# Patient Record
Sex: Male | Born: 1970 | Race: White | Hispanic: No | Smoking: Former smoker
Health system: Southern US, Community
[De-identification: ages and names within clinical notes are randomized; demographics above are authoritative.]

## PROBLEM LIST (undated history)

## (undated) DIAGNOSIS — E119 Type 2 diabetes mellitus without complications: Secondary | ICD-10-CM

## (undated) HISTORY — PX: ANKLE FRACTURE SURGERY: SHX122

---

## 2003-01-14 ENCOUNTER — Emergency Department (HOSPITAL_COMMUNITY): Admission: EM | Admit: 2003-01-14 | Discharge: 2003-01-14 | Payer: Self-pay | Admitting: Emergency Medicine

## 2014-10-04 ENCOUNTER — Emergency Department (HOSPITAL_COMMUNITY)
Admission: EM | Admit: 2014-10-04 | Discharge: 2014-10-04 | Disposition: A | Discharge status: Home / Self Care | Payer: Self-pay | Attending: Emergency Medicine | Admitting: Emergency Medicine

## 2014-10-04 ENCOUNTER — Encounter (HOSPITAL_COMMUNITY): Payer: Self-pay | Admitting: Emergency Medicine

## 2014-10-04 DIAGNOSIS — E1165 Type 2 diabetes mellitus with hyperglycemia: Secondary | ICD-10-CM | POA: Insufficient documentation

## 2014-10-04 DIAGNOSIS — Z720 Tobacco use: Secondary | ICD-10-CM | POA: Insufficient documentation

## 2014-10-04 DIAGNOSIS — R739 Hyperglycemia, unspecified: Secondary | ICD-10-CM

## 2014-10-04 HISTORY — DX: Type 2 diabetes mellitus without complications: E11.9

## 2014-10-04 LAB — URINE MICROSCOPIC-ADD ON

## 2014-10-04 LAB — URINALYSIS, ROUTINE W REFLEX MICROSCOPIC
Bilirubin Urine: NEGATIVE
Glucose, UA: >1000 mg/dL — AB
Hgb urine dipstick: NEGATIVE
Ketones, ur: 40 mg/dL — AB
Leukocytes, UA: NEGATIVE
Nitrite: NEGATIVE
Protein, ur: NEGATIVE mg/dL
Specific Gravity, Urine: 1.034 — ABNORMAL HIGH (ref 1.005–1.030)
Urobilinogen, UA: 0.2 mg/dL (ref 0.0–1.0)
pH: 5 (ref 5.0–8.0)

## 2014-10-04 LAB — CBC WITH DIFFERENTIAL/PLATELET
Basophils Absolute: 0 10*3/uL (ref 0.0–0.1)
Basophils Relative: 0 % (ref 0–1)
Eosinophils Absolute: 0.2 10*3/uL (ref 0.0–0.7)
Eosinophils Relative: 1 % (ref 0–5)
HCT: 43.8 % (ref 39.0–52.0)
Hemoglobin: 16.1 g/dL (ref 13.0–17.0)
Lymphocytes Relative: 23 % (ref 12–46)
Lymphs Abs: 2.6 10*3/uL (ref 0.7–4.0)
MCH: 30.2 pg (ref 26.0–34.0)
MCHC: 36.8 g/dL — ABNORMAL HIGH (ref 30.0–36.0)
MCV: 82.2 fL (ref 78.0–100.0)
Monocytes Absolute: 0.6 10*3/uL (ref 0.1–1.0)
Monocytes Relative: 5 % (ref 3–12)
Neutro Abs: 7.6 10*3/uL (ref 1.7–7.7)
Neutrophils Relative %: 70 % (ref 43–77)
Platelets: 209 10*3/uL (ref 150–400)
RBC: 5.33 MIL/uL (ref 4.22–5.81)
RDW: 12.9 % (ref 11.5–15.5)
WBC: 10.9 10*3/uL — ABNORMAL HIGH (ref 4.0–10.5)

## 2014-10-04 LAB — BASIC METABOLIC PANEL
Anion gap: 19 — ABNORMAL HIGH (ref 5–15)
BUN: 15 mg/dL (ref 6–23)
CO2: 21 mEq/L (ref 19–32)
Calcium: 10.1 mg/dL (ref 8.4–10.5)
Chloride: 88 mEq/L — ABNORMAL LOW (ref 96–112)
Creatinine, Ser: 0.75 mg/dL (ref 0.50–1.35)
GFR calc Af Amer: >90 mL/min (ref >90)
GFR calc non Af Amer: >90 mL/min (ref >90)
Glucose, Bld: 563 mg/dL (ref 70–99)
Potassium: 4.5 mEq/L (ref 3.7–5.3)
Sodium: 128 mEq/L — ABNORMAL LOW (ref 137–147)

## 2014-10-04 LAB — CBG MONITORING, ED
Glucose-Capillary: 571 mg/dL (ref 70–99)
Glucose-Capillary: 374 mg/dL — ABNORMAL HIGH (ref 70–99)
Glucose-Capillary: 302 mg/dL — ABNORMAL HIGH (ref 70–99)

## 2014-10-04 MED ORDER — SODIUM CHLORIDE 0.9 % IV BOLUS (SEPSIS)
2000.0000 mL | Freq: Once | INTRAVENOUS | Status: AC
  Administered 2014-10-04: 2000 mL via INTRAVENOUS

## 2014-10-04 MED ORDER — INSULIN ASPART 100 UNIT/ML ~~LOC~~ SOLN
5.0000 [IU] | Freq: Once | SUBCUTANEOUS | Status: AC
  Administered 2014-10-04: 5 [IU] via INTRAVENOUS
  Filled 2014-10-04: qty 1

## 2014-10-04 MED ORDER — ONDANSETRON HCL 4 MG/2ML IJ SOLN
4.0000 mg | Freq: Once | INTRAMUSCULAR | Status: AC
  Administered 2014-10-04: 4 mg via INTRAVENOUS
  Filled 2014-10-04: qty 2

## 2014-10-04 MED ORDER — KETOROLAC TROMETHAMINE 15 MG/ML IJ SOLN
15.0000 mg | Freq: Once | INTRAMUSCULAR | Status: AC
  Administered 2014-10-04: 15 mg via INTRAVENOUS
  Filled 2014-10-04: qty 1

## 2014-10-04 MED ORDER — HYDROMORPHONE HCL 1 MG/ML IJ SOLN
1.0000 mg | Freq: Once | INTRAMUSCULAR | Status: AC
  Administered 2014-10-04: 1 mg via INTRAVENOUS
  Filled 2014-10-04: qty 1

## 2014-10-04 MED ORDER — SODIUM CHLORIDE 0.9 % IV BOLUS (SEPSIS)
1000.0000 mL | Freq: Once | INTRAVENOUS | Status: AC
  Administered 2014-10-04: 1000 mL via INTRAVENOUS

## 2014-10-04 MED ORDER — OXYCODONE-ACETAMINOPHEN 5-325 MG PO TABS
1.0000 | ORAL_TABLET | ORAL | Status: DC | PRN
Start: 2014-10-04 — End: 2014-10-05

## 2014-10-04 MED ORDER — INSULIN ASPART 100 UNIT/ML ~~LOC~~ SOLN
10.0000 [IU] | Freq: Once | SUBCUTANEOUS | Status: AC
  Administered 2014-10-04: 10 [IU] via INTRAVENOUS
  Filled 2014-10-04: qty 1

## 2014-10-04 MED ORDER — LISINOPRIL 40 MG PO TABS: 40.0000 mg | ORAL_TABLET | Freq: Every day | ORAL | Status: DC

## 2014-10-04 MED ORDER — METFORMIN HCL 1000 MG PO TABS: 1000.0000 mg | ORAL_TABLET | Freq: Two times a day (BID) | ORAL | Status: DC

## 2014-10-04 MED ORDER — GLIPIZIDE 10 MG PO TABS: 10.0000 mg | ORAL_TABLET | Freq: Every day | ORAL | Status: DC

## 2014-10-04 NOTE — ED Notes (Signed)
Per pt, states he has been out of meds for 2 weeks-left work because he didn't feel well-checked sugar and it read high

## 2014-10-04 NOTE — ED Provider Notes (Signed)
CSN: [id number]     Arrival date & time 10/04/14  1336 History   First MD Initiated Contact with Patient 10/04/14 1413     Chief Complaint  Patient presents with  . Hyperglycemia     (Consider location/radiation/quality/duration/timing/severity/associated sxs/prior Treatment) HPI   43 year old male with hyperglycemia. Diagnosed history diabetes. Previously prescribed metformin and glipizide. He stopped taking these for the past 2-3 weeks as he ran out. Unable to get new prescriptions without appointment with PCP as he has not been seen in some time at the office. Today while he was at work he began to ask some blurred vision. He checked his blood sugar and it registered as "high." Patient states over the last week or so he has had polyuria and polydipsia. Very fatigued. Even when on his medications, he states that his blood sugar was "never less than 200." No fever or chills. No urinary complaints aside from polyuria. No cough. No shortness of breath. No confusion person and other at bedside. Review of systems initially positive for a left upper molar dental pain. He has a broken tooth which is then putting off seeking dental care for. No facial swelling. No difficulty breathing or swallowing.  Past Medical History  Diagnosis Date  . Diabetes mellitus without complication    Past Surgical History  Procedure Laterality Date  . Ankle fracture surgery     No family history on file. History  Substance Use Topics  . Smoking status: Current Every Day Smoker  . Smokeless tobacco: Not on file  . Alcohol Use: No    Review of Systems  All systems reviewed and negative, other than as noted in HPI.   Allergies  Review of patient's allergies indicates not on file.  Home Medications   Prior to Admission medications   Not on File   BP 127/72 mmHg  Pulse 79  Temp(Src) 98.5 F (36.9 C) (Oral)  Resp 18  SpO2 96% Physical Exam  Constitutional: He appears well-developed and  well-nourished. No distress.  HENT:  Head: Normocephalic and atraumatic.  Mouth/Throat: Oropharynx is clear and moist.  Eyes: Conjunctivae are normal. Pupils are equal, round, and reactive to light. Right eye exhibits no discharge. Left eye exhibits no discharge.  Neck: Neck supple.  Cardiovascular: Normal rate, regular rhythm and normal heart sounds.  Exam reveals no gallop and no friction rub.   No murmur heard. Pulmonary/Chest: Effort normal and breath sounds normal. No respiratory distress. He has no wheezes.  Abdominal: Soft. He exhibits no distension. There is no tenderness.  Musculoskeletal: He exhibits no edema or tenderness.  Neurological: He is alert.  Skin: Skin is warm and dry.  Psychiatric: He has a normal mood and affect. His behavior is normal. Thought content normal.  Nursing note and vitals reviewed.   ED Course  Procedures (including critical care time) Labs Review Labs Reviewed  BASIC METABOLIC PANEL - Abnormal; Notable for the following:    Sodium 128 (*)    Chloride 88 (*)    Glucose, Bld 563 (*)    Anion gap 19 (*)    All other components within normal limits  CBC WITH DIFFERENTIAL - Abnormal; Notable for the following:    WBC 10.9 (*)    MCHC 36.8 (*)    All other components within normal limits  URINALYSIS, ROUTINE W REFLEX MICROSCOPIC - Abnormal; Notable for the following:    Specific Gravity, Urine 1.034 (*)    Glucose, UA >1000 (*)    Ketones, ur 40 (*)  All other components within normal limits  CBG MONITORING, ED - Abnormal; Notable for the following:    Glucose-Capillary 571 (*)    All other components within normal limits  CBG MONITORING, ED - Abnormal; Notable for the following:    Glucose-Capillary 374 (*)    All other components within normal limits  CBG MONITORING, ED - Abnormal; Notable for the following:    Glucose-Capillary 302 (*)    All other components within normal limits  URINE MICROSCOPIC-ADD ON    Imaging Review No  results found.   EKG Interpretation None      MDM   Final diagnoses:  Hyperglycemia  Poorly controlled diabetes mellitus    43 year old male with hyperglycemia. No metabolic acidosis, but he does have an anion gap and ketonuria. Tired appearing, but nontoxic. Pt is not interested in being admitted although I think it is prudent particularly with no follow-up currently. He has medical decision making capability. Has been off of medications for 2-3 weeks. Reports glucose "never under 200" even before this though. Previously on glipizide 5 mg daily and metformin 500 mg BID previously. New prescriptions provided. Suspect he will additionally need to be started on insulin. Discussed need to establish PCP ASAP. Resource list for this as well as dental follow-up for dental pain.    Raeford RazorStephen Lareen Mullings, MD 10/06/14 [phone]

## 2014-10-04 NOTE — Discharge Instructions (Signed)
Blood Glucose Monitoring Monitoring your blood glucose (also know as blood sugar) helps you to manage your diabetes. It also helps you and your health care provider monitor your diabetes and determine how well your treatment plan is working. WHY SHOULD YOU MONITOR YOUR BLOOD GLUCOSE?  It can help you understand how food, exercise, and medicine affect your blood glucose.  It allows you to know what your blood glucose is at any given moment. You can quickly tell if you are having low blood glucose (hypoglycemia) or high blood glucose (hyperglycemia).  It can help you and your health care provider know how to adjust your medicines.  It can help you understand how to manage an illness or adjust medicine for exercise. WHEN SHOULD YOU TEST? Your health care provider will help you decide how often you should check your blood glucose. This may depend on the type of diabetes you have, your diabetes control, or the types of medicines you are taking. Be sure to write down all of your blood glucose readings so that this information can be reviewed with your health care provider. See below for examples of testing times that your health care provider may suggest. Type 1 Diabetes  Test 4 times a day if you are in good control, using an insulin pump, or perform multiple daily injections.  If your diabetes is not well controlled or if you are sick, you may need to monitor more often.  It is a good idea to also monitor:  Before and after exercise.  Between meals and 2 hours after a meal.  Occasionally between 2:00 a.m. and 3:00 a.m. Type 2 Diabetes  It can vary with each person, but generally, if you are on insulin, test 4 times a day.  If you take medicines by mouth (orally), test 2 times a day.  If you are on a controlled diet, test once a day.  If your diabetes is not well controlled or if you are sick, you may need to monitor more often. HOW TO MONITOR YOUR BLOOD GLUCOSE Supplies  Needed  Blood glucose meter.  Test strips for your meter. Each meter has its own strips. You must use the strips that go with your own meter.  A pricking needle (lancet).  A device that holds the lancet (lancing device).  A journal or log book to write down your results. Procedure  Wash your hands with soap and water. Alcohol is not preferred.  Prick the side of your finger (not the tip) with the lancet.  Gently milk the finger until a small drop of blood appears.  Follow the instructions that come with your meter for inserting the test strip, applying blood to the strip, and using your blood glucose meter. Other Areas to Get Blood for Testing Some meters allow you to use other areas of your body (other than your finger) to test your blood. These areas are called alternative sites. The most common alternative sites are:  The forearm.  The thigh.  The back area of the lower leg.  The palm of the hand. The blood flow in these areas is slower. Therefore, the blood glucose values you get may be delayed, and the numbers are different from what you would get from your fingers. Do not use alternative sites if you think you are having hypoglycemia. Your reading will not be accurate. Always use a finger if you are having hypoglycemia. Also, if you cannot feel your lows (hypoglycemia unawareness), always use your fingers for your  blood glucose checks. ADDITIONAL TIPS FOR GLUCOSE MONITORING  Do not reuse lancets.  Always carry your supplies with you.  All blood glucose meters have a 24-hour "hotline" number to call if you have questions or need help.  Adjust (calibrate) your blood glucose meter with a control solution after finishing a few boxes of strips. BLOOD GLUCOSE RECORD KEEPING It is a good idea to keep a daily record or log of your blood glucose readings. Most glucose meters, if not all, keep your glucose records stored in the meter. Some meters come with the ability to download  your records to your home computer. Keeping a record of your blood glucose readings is especially helpful if you are wanting to look for patterns. Make notes to go along with the blood glucose readings because you might forget what happened at that exact time. Keeping good records helps you and your health care provider to work together to achieve good diabetes management.  Document Released: 11/14/2003 Document Revised: 03/28/2014 Document Reviewed: 04/05/2013 [facility] Patient Information 2015 [place], [place]. This information is not intended to replace advice given to you by your health care provider. Make sure you discuss any questions you have with your health care provider.  Diabetes and Standards of Medical Care Diabetes is complicated. You may find that your diabetes team includes a dietitian, nurse, diabetes educator, eye doctor, and more. To help everyone know what is going on and to help you get the care you deserve, the following schedule of care was developed to help keep you on track. Below are the tests, exams, vaccines, medicines, education, and plans you will need. HbA1c test This test shows how well you have controlled your glucose over the past 2-3 months. It is used to see if your diabetes management plan needs to be adjusted.   It is performed at least 2 times a year if you are meeting treatment goals.  It is performed 4 times a year if therapy has changed or if you are not meeting treatment goals. Blood pressure test  This test is performed at every routine medical visit. The goal is less than 140/90 mm Hg for most people, but 130/80 mm Hg in some cases. Ask your health care provider about your goal. Dental exam  Follow up with the dentist regularly. Eye exam  If you are diagnosed with type 1 diabetes as a child, get an exam upon reaching the age of 23 years or older and have had diabetes for 3-5 years. Yearly eye exams are recommended after that initial eye exam.  If you  are diagnosed with type 1 diabetes as an adult, get an exam within 5 years of diagnosis and then yearly.  If you are diagnosed with type 2 diabetes, get an exam as soon as possible after the diagnosis and then yearly. Foot care exam  Visual foot exams are performed at every routine medical visit. The exams check for cuts, injuries, or other problems with the feet.  A comprehensive foot exam should be done yearly. This includes visual inspection as well as assessing foot pulses and testing for loss of sensation.  Check your feet nightly for cuts, injuries, or other problems with your feet. Tell your health care provider if anything is not healing. Kidney function test (urine microalbumin)  This test is performed once a year.  Type 1 diabetes: The first test is performed 5 years after diagnosis.  Type 2 diabetes: The first test is performed at the time of diagnosis.  A serum creatinine and estimated glomerular filtration rate (eGFR) test is done once a year to assess the level of chronic kidney disease (CKD), if present. Lipid profile (cholesterol, HDL, LDL, triglycerides)  Performed every 5 years for most people.  The goal for LDL is less than 100 mg/dL. If you are at high risk, the goal is less than 70 mg/dL.  The goal for HDL is 40 mg/dL-50 mg/dL for men and 50 mg/dL-60 mg/dL for women. An HDL cholesterol of 60 mg/dL or higher gives some protection against heart disease.  The goal for triglycerides is less than 150 mg/dL. Influenza vaccine, pneumococcal vaccine, and hepatitis B vaccine  The influenza vaccine is recommended yearly.  It is recommended that people with diabetes who are over 38 years old get the pneumonia vaccine. In some cases, two separate shots may be given. Ask your health care provider if your pneumonia vaccination is up to date.  The hepatitis B vaccine is also recommended for adults with diabetes. Diabetes self-management education  Education is recommended  at diagnosis and ongoing as needed. Treatment plan  Your treatment plan is reviewed at every medical visit. Document Released: 09/08/2009 Document Revised: 03/28/2014 Document Reviewed: 04/13/2013 [facility] Patient Information 2015 [place], [place]. This information is not intended to replace advice given to you by your health care provider. Make sure you discuss any questions you have with your health care provider.  Hyperglycemia Hyperglycemia occurs when the glucose (sugar) in your blood is too high. Hyperglycemia can happen for many reasons, but it most often happens to people who do not know they have diabetes or are not managing their diabetes properly.  CAUSES  Whether you have diabetes or not, there are other causes of hyperglycemia. Hyperglycemia can occur when you have diabetes, but it can also occur in other situations that you might not be as aware of, such as: Diabetes  If you have diabetes and are having problems controlling your blood glucose, hyperglycemia could occur because of some of the following reasons:  Not following your meal plan.  Not taking your diabetes medications or not taking it properly.  Exercising less or doing less activity than you normally do.  Being sick. Pre-diabetes  This cannot be ignored. Before people develop Type 2 diabetes, they almost always have "pre-diabetes." This is when your blood glucose levels are higher than normal, but not yet high enough to be diagnosed as diabetes. Research has shown that some long-term damage to the body, especially the heart and circulatory system, may already be occurring during pre-diabetes. If you take action to manage your blood glucose when you have pre-diabetes, you may delay or prevent Type 2 diabetes from developing. Stress  If you have diabetes, you may be "diet" controlled or on oral medications or insulin to control your diabetes. However, you may find that your blood glucose is higher than usual in the  hospital whether you have diabetes or not. This is often referred to as "stress hyperglycemia." Stress can elevate your blood glucose. This happens because of hormones put out by the body during times of stress. If stress has been the cause of your high blood glucose, it can be followed regularly by your caregiver. That way he/she can make sure your hyperglycemia does not continue to get worse or progress to diabetes. Steroids  Steroids are medications that act on the infection fighting system (immune system) to block inflammation or infection. One side effect can be a rise in blood glucose. Most people can  produce enough extra insulin to allow for this rise, but for those who cannot, steroids make blood glucose levels go even higher. It is not unusual for steroid treatments to "uncover" diabetes that is developing. It is not always possible to determine if the hyperglycemia will go away after the steroids are stopped. A special blood test called an A1c is sometimes done to determine if your blood glucose was elevated before the steroids were started. SYMPTOMS  Thirsty.  Frequent urination.  Dry mouth.  Blurred vision.  Tired or fatigue.  Weakness.  Sleepy.  Tingling in feet or leg. DIAGNOSIS  Diagnosis is made by monitoring blood glucose in one or all of the following ways:  A1c test. This is a chemical found in your blood.  Fingerstick blood glucose monitoring.  Laboratory results. TREATMENT  First, knowing the cause of the hyperglycemia is important before the hyperglycemia can be treated. Treatment may include, but is not be limited to:  Education.  Change or adjustment in medications.  Change or adjustment in meal plan.  Treatment for an illness, infection, etc.  More frequent blood glucose monitoring.  Change in exercise plan.  Decreasing or stopping steroids.  Lifestyle changes. HOME CARE INSTRUCTIONS   Test your blood glucose as directed.  Exercise  regularly. Your caregiver will give you instructions about exercise. Pre-diabetes or diabetes which comes on with stress is helped by exercising.  Eat wholesome, balanced meals. Eat often and at regular, fixed times. Your caregiver or nutritionist will give you a meal plan to guide your sugar intake.  Being at an ideal weight is important. If needed, losing as little as 10 to 15 pounds may help improve blood glucose levels. SEEK MEDICAL CARE IF:   You have questions about medicine, activity, or diet.  You continue to have symptoms (problems such as increased thirst, urination, or weight gain). SEEK IMMEDIATE MEDICAL CARE IF:   You are vomiting or have diarrhea.  Your breath smells fruity.  You are breathing faster or slower.  You are very sleepy or incoherent.  You have numbness, tingling, or pain in your feet or hands.  You have chest pain.  Your symptoms get worse even though you have been following your caregiver's orders.  If you have any other questions or concerns. Document Released: 05/07/2001 Document Revised: 02/03/2012 Document Reviewed: 03/09/2012 [facility] Patient Information 2015 [place], [place]. This information is not intended to replace advice given to you by your health care provider. Make sure you discuss any questions you have with your health care provider.   Emergency Department Resource Guide 1) Find a Doctor and Pay Out of Pocket Although you won't have to find out who is covered by your insurance plan, it is a good idea to ask around and get recommendations. You will then need to call the office and see if the doctor you have chosen will accept you as a new patient and what types of options they offer for patients who are self-pay. Some doctors offer discounts or will set up payment plans for their patients who do not have insurance, but you will need to ask so you aren't surprised when you get to your appointment.  2) Contact Your Local Health  Department Not all health departments have doctors that can see patients for sick visits, but many do, so it is worth a call to see if yours does. If you don't know where your local health department is, you can check in your phone book. The CDC also  has a tool to help you locate your state's health department, and many state websites also have listings of all of their local health departments.  3) Find a [facility] If your illness is not likely to be very severe or complicated, you may want to try a walk in clinic. These are popping up all over the country in pharmacies, drugstores, and shopping centers. They're usually staffed by nurse practitioners or physician assistants that have been trained to treat common illnesses and complaints. They're usually fairly quick and inexpensive. However, if you have serious medical issues or chronic medical problems, these are probably not your best option.  No Primary Care Doctor: - Call Health Connect at  [phone] - they can help you locate a primary care doctor that  accepts your insurance, provides certain services, etc. - Physician Referral Service- [phone]  Chronic Pain Problems: Organization         Address  Phone   Notes  [facility]  [phone] Patients need to be referred by their primary care doctor.   Medication Assistance: Organization         Address  Phone   Notes  [facility] Medication [facility] [address] [place], [place] [address] [phone] --Must be a resident of [facility] -- Must have NO insurance coverage whatsoever (no Medicaid/ Medicare, etc.) -- The pt. MUST have a primary care doctor that directs their care regularly and follows them in the community   MedAssist  [phone]   [employer]  [phone]    Agencies that provide inexpensive medical care: Organization         Address  Phone   Notes  [place]  [phone]   [facility]    [phone]   [facility] [place], [place] [address] [phone]   [place] [address], [place] [phone]   Planned Parenthood    [phone]   [facility]    [phone]   [place] and [address], [place] Phone:  [phone], Fax:  [phone] Hours of Operation:  9 am - 6 pm, M-F.  Also accepts Medicaid/Medicare and self-pay.  [facility] for [place], [address], [place] Phone: [phone], Fax: [phone]. Hours of Operation:  8:30 am - 5:30 pm, M-F.  Also accepts Medicaid and self-pay.  [facility] [place] [address] [place] Phone: [phone]   [place], [place], [place] [phone], Ext. [phone] Mondays & Thursdays: 7-9 AM.  First 15 patients are seen on a first come, first serve basis.    [place] Providers:  Organization         Address  Phone   Notes  [facility] [address],  [phone] Also accepts self-pay patients.  [facility] [address], [place]  [phone]   [place], [address], [place] [phone]   [facility] [address], [place] [phone]   Lucianne Lei [address], [place]   [phone] Only accepts [place] Access [place] patients after they have their name applied to their card.   Self-Pay (no insurance) in [place]  County:  Organization         Address  Phone   Notes  Sickle Cell Patients, [facility] [place] [phone]   [facility] [place] [phone]   [facility] [place]  [place], [address],  [place] [phone]   [facility]/Dr. Osei-Bonsu  [address], [place] or [address], [place] [phone] Phone number for both [place] and [place] locations is the same.  Urgent Medical and [facility] [address] [place] [phone]   [facility] [address], [place] or [address] [phone]   [facility] [address], [place] [phone], phone; [phone], fax Sees patients 1st and 3rd Saturday of every month.  Must not qualify for public or private insurance (i.e. Medicaid, Medicare, [place] Health Choice, Veterans' Benefits)  Household income should be no more than 200% of the poverty level The clinic cannot treat you if you are pregnant or think you are pregnant  Sexually transmitted diseases are not treated at the clinic.    Dental Care: Organization         Address  Phone  Notes  [facility] Department of [facility] [place] [phone] Accepts children up to age 54 who are enrolled in [place] or [place]; pregnant women with a Medicaid card; and children who have applied for Medicaid or [place] Health Choice, but were declined, whose parents can pay a reduced fee at time of service.  [facility] Department of [facility]  [address], [place] [phone] Accepts children up to age 42 who are enrolled in [place] or [place]; pregnant women with a Medicaid card; and children who have applied for Medicaid or [place] Health Choice, but were declined, whose parents can pay a reduced fee at time of service.  [place] Adult Dental Access PROGRAM  [place] [phone] Patients are seen by appointment only. Walk-ins are not accepted. [place] will see patients 32 years of age and older. Monday - Tuesday (8am-5pm) Most Wednesdays  (8:30-5pm) $30 per visit, cash only  [facility] Adult Dental Access PROGRAM  [address], [facility] [phone] Patients are seen by appointment only. Walk-ins are not accepted. [place] will see patients 89 years of age and older. One Wednesday Evening (Monthly: Volunteer Based).  $30 per visit, cash only  [place]  [phone] for adults; Children under age 58, call Graduate Pediatric Dentistry at [phone]. Children aged 1-14, please call [phone] to request a pediatric application.  Dental services are provided in all areas of dental care including fillings, crowns and bridges, complete and partial dentures, implants, gum treatment, root canals, and extractions. Preventive care is also provided. Treatment is provided to both adults and children. Patients are selected via a lottery and there is often a waiting list.   [facility] [address], [place]  [phone] [email or web address]   [facility] [address] [place], [place] [phone], Ext. [phone] Second and Fourth Thursday of each month, opens at 6:30 AM; Clinic ends at 9 AM.  Patients are seen on a first-come first-served basis, and a limited number are seen during each clinic.  [facility]  [address] Hillard Danker [place], [place] [phone]   Eligibility Requirements You must have lived in [place], [place], or [place] counties for at least the last three months.   You cannot be eligible for state or federal sponsored [employer], including [employer], [place], or [employer].   You generally cannot be eligible for healthcare insurance through your employer.    How to apply: Eligibility screenings are held every Tuesday and Wednesday afternoon from 1:00 pm until 4:00 pm. You do not need an appointment for the interview!  [facility] [address] [place], [place]   [place]  [place] Department  [place]  [phone]    Behavioral Health Resources in the Community: Intensive Outpatient Programs Organization         Address  Phone  Notes  [place]ty Bracken. [address], [place], [place] [phone]   [facility] Outpatient [address], [place], [place]   ADS: Alcohol & Drug Svcs [address] [place], [place]   [place] [address]  [place], [place] or [phone]   Substance Abuse Resources Organization         Address  Phone  Notes  Alcohol and Drug Services  [phone]   [place]  [phone]   [facility] [place]   Chinita Pester  [phone]   Residential & Outpatient Substance Abuse Program  [phone]   Psychological Services Organization         Address  Phone  Notes  [facility] [place]  [place]  [phone]   [place] [address] [place] or [phone]    Mobile Crisis Teams Organization         Address  Phone  Notes  [facility], Mobile Crisis Care Unit  [phone]   [facility]  [address] [place], [place]   [place] Levels [address] [place] [phone]    Self-Help/Support Groups Organization         Address  Phone             Notes  [place]. of [place] - variety of support groups  [place] Call for more information  Narcotics Anonymous (NA), [facility] [address], [employer] [place]  2 meetings at this location   Special educational needs teacher         Address  Phone  Notes  [facility]idential Treatment [place],    [place]  [phone]   [facility]  [address] [place] [address], [place], [place]   [place], [place] [phone] Admissions: 8am-3pm M-F  Incentives Substance [place] [address]    [place], [place] [phone]   [facility] [address] Jadene Pierini [place], [place]   [facility] [address],  [place], [place]   [facility] Programs - Intensive Outpatient [address] Kristeen [address], [place], [place]   [facility] ([place].) [place].,  [place], [place] or [phone]   [facility] (RTS) [address], [place], [place] Accepts Medicaid  [facility] [place] [address]  [place] [phone] Substance Abuse/Addiction  [place] Resources Organization         Address  Phone  Notes  [facility]  [phone]   Domenic Schwab, PhD [address] Arlis Porta [place], [place]   [phone] or [phone]   [place], [place] [phone]   [address], [place], [place] [phone] Insurance/Medicaid/sponsorship through [facility] and Families [address], [address] [place]                                    [place], [place] [phone] [place] [address], [place] [phone]    Dr. Adele Schilder  [phone]   [facility] Dept. 1) [address], [place] 2) [place] 3)  [address], [place] [phone]  [phone]   [facility] [phone] or [phone] (After Hours)

## 2014-10-04 NOTE — Progress Notes (Signed)
  CARE MANAGEMENT ED NOTE 10/04/2014  Patient:  Bradley Miller,Bradley Miller   Account Number:  [id number]  Date Initiated:  10/04/2014  Documentation initiated by:  Radford PaxFERRERO,Tyerra Loretto  Subjective/Objective Assessment:   Patient presents to Ed with hyperglycemia     Subjective/Objective Assessment Detail:   Blood sugar 574.  Patient with pmhx of DM type 2.  Patient without DM medications for 2 weeks.     Action/Plan:   Action/Plan Detail:   Anticipated DC Date:  10/04/2014     Status Recommendation to Physician:   Result of Recommendation:    Other ED Services  Consult Working Plan    DC Planning Services  Other  PCP issues    Choice offered to / List presented to:            Status of service:  Completed, signed off  ED Comments:   ED Comments Detail:  EDCM spoke to patient and his fiance Bradley Miller at bedside. Patient confirms she does not have a pcp or [place].  EDCM provide patient with pamphlet to [facility], informed patient of services there and walk in times.  EDCM also provided patient with list of pcps who accept self pay patients, list of discount pharmacies and websites needymeds.org and GoodRX.com for medication assistance, phone number to inquire about the orange card, phone number to inquire about Mediciad, phone number to inquire about the Affordable Care Act, financial resources in the community such as local churches, salvation army, urban ministries, and dental assistance for uninsured patients. EDCM able to make appointment for patient at 215pm on 11/11 at [facility] for follow up.  Patient agreeable to this date and time. Patient thankfulf or resources.  No further EDCM needs at this time.

## 2014-10-05 ENCOUNTER — Ambulatory Visit: Payer: Self-pay | Attending: Internal Medicine | Admitting: Internal Medicine

## 2014-10-05 ENCOUNTER — Telehealth: Payer: Self-pay | Admitting: Emergency Medicine

## 2014-10-05 VITALS — BP 111/77 | HR 88 | Temp 98.0°F | Resp 17 | Ht 74.0 in | Wt 299.2 lb

## 2014-10-05 DIAGNOSIS — E118 Type 2 diabetes mellitus with unspecified complications: Secondary | ICD-10-CM

## 2014-10-05 LAB — HEPATIC FUNCTION PANEL
ALT: 22 U/L (ref 0–53)
AST: 18 U/L (ref 0–37)
Albumin: 3.8 g/dL (ref 3.5–5.2)
Alkaline Phosphatase: 115 U/L (ref 39–117)
BILIRUBIN DIRECT: 0.1 mg/dL (ref 0.0–0.3)
Indirect Bilirubin: 0.3 mg/dL (ref 0.2–1.2)
Total Bilirubin: 0.4 mg/dL (ref 0.2–1.2)
Total Protein: 6.2 g/dL (ref 6.0–8.3)

## 2014-10-05 LAB — POCT GLYCOSYLATED HEMOGLOBIN (HGB A1C): Hemoglobin A1C: 13.3

## 2014-10-05 LAB — GLUCOSE, POCT (MANUAL RESULT ENTRY)
POC GLUCOSE: 556 mg/dL — AB (ref 70–99)
POC Glucose: 560 mg/dl — AB (ref 70–99)

## 2014-10-05 MED ORDER — INSULIN GLARGINE 100 UNIT/ML SOLOSTAR PEN: 30.0000 [IU] | PEN_INJECTOR | Freq: Every day | SUBCUTANEOUS | Status: DC

## 2014-10-05 MED ORDER — INSULIN DETEMIR 100 UNIT/ML ~~LOC~~ SOLN: 30.0000 [IU] | Freq: Every day | SUBCUTANEOUS | Status: DC

## 2014-10-05 MED ORDER — GLIPIZIDE 10 MG PO TABS: 10.0000 mg | ORAL_TABLET | Freq: Every day | ORAL | Status: DC

## 2014-10-05 MED ORDER — FREESTYLE SYSTEM KIT: 1.0000 | PACK | Freq: Three times a day (TID) | Status: DC

## 2014-10-05 MED ORDER — INSULIN ASPART 100 UNIT/ML ~~LOC~~ SOLN
10.0000 [IU] | Freq: Once | SUBCUTANEOUS | Status: AC
  Administered 2014-10-05: 10 [IU] via SUBCUTANEOUS

## 2014-10-05 MED ORDER — INSULIN ASPART 100 UNIT/ML ~~LOC~~ SOLN: 8.0000 [IU] | Freq: Three times a day (TID) | SUBCUTANEOUS | Status: DC

## 2014-10-05 MED ORDER — INSULIN ASPART 100 UNIT/ML ~~LOC~~ SOLN
20.0000 [IU] | Freq: Once | SUBCUTANEOUS | Status: AC
  Administered 2014-10-05: 20 [IU] via SUBCUTANEOUS

## 2014-10-05 MED ORDER — METFORMIN HCL 500 MG PO TABS: 500.0000 mg | ORAL_TABLET | Freq: Two times a day (BID) | ORAL | Status: DC

## 2014-10-05 MED ORDER — SODIUM CHLORIDE 0.9 % IV BOLUS (SEPSIS)
1000.0000 mL | Freq: Once | INTRAVENOUS | Status: AC
  Administered 2014-10-05: 1000 mL via INTRAVENOUS

## 2014-10-05 NOTE — Progress Notes (Unsigned)
Patient ID: Bradley GreavesJimmy W Lamphier, male   DOB: Apr 14, 1971, 43 y.o.   MRN: [id number]  Patient Demographics  Christa SeeJimmy Willits, is a 43 y.o. male  JYN:[id number]  QMV:[id number]  DOB - Apr 14, 1971  Chief Complaint  Patient presents with  . Follow-up  . Hyperglycemia  . Dental Pain        Subjective:   Christa SeeJimmy Eisenhuth today is presenting on 10/05/2014 to establish primary care  HPI: Patient is 43 y.o. male with past medical history of DM-2 presenting to the clinic today for evaluation of the above noted complaint.Patient has a long standing hx of DM-and has been on Glipizide and Metformin in the past. He has not been compliant as he ran out of his medications approximately 1 month back. He as seen in the ED on 10/04/14 for hyperglycemia and referred to the [facility] for further evaluation and treatment.While in the ED he had blood work done which showed a sugar of 563, and a anion gap of 19. Patient did not want to get admitted. He continues to have polyuria/polydipsia.I gave him the option of getting directly admitted to the hospital,however he is insisting on going back to work, he is willing to however stay and get IVF and insulin in the clinic.He is also willing to come for follow up on 11/16.  Patient has also has No headache, No chest pain, No abdominal pain,No Nausea, No new weakness tingling or numbness, No Cough or SOB.    Objective:     Filed Vitals:   10/05/14 1405  BP: 111/77  Pulse: 88  Temp: 98 F (36.7 C)  TempSrc: Oral  Resp: 17  Height: 6\' 2"  (1.88 m)  Weight: 299 lb 3.2 oz (135.716 kg)  SpO2: 96%     ALLERGIES:   Allergies  Allergen Reactions  . Morphine And Related Itching    PAST MEDICAL HISTORY: Past Medical History  Diagnosis Date  . Diabetes mellitus without complication     PAST SURGICAL HISTORY: Past Surgical History  Procedure Laterality Date  . Ankle fracture surgery      FAMILY HISTORY: No family history on file.  MEDICATIONS: Current  Outpatient Prescriptions on File Prior to Visit  Medication Sig  . pravastatin (PRAVACHOL) 40 MG tablet Take 40 mg by mouth daily.   No current facility-administered medications on file prior to visit.    SOCIAL HISTORY:   reports that he has been smoking.  He does not have any smokeless tobacco history on file. He reports that he does not drink alcohol or use illicit drugs.  REVIEW OF SYSTEMS:  Constitutional:   No   Fevers, chills, fatigue.  HEENT:    No headaches, Sore throat,   Cardio-vascular: No chest pain,  Orthopnea, swelling in lower extremities, anasarca, palpitations  GI:  No abdominal pain, nausea, vomiting, diarrhea  Resp: No shortness of breath,  No coughing up of blood.No cough.No wheezing.  Skin:  no rash or lesions.  GU:  no dysuria, change in color of urine, no urgency or frequency.  No flank pain.  Musculoskeletal: No joint pain or swelling.  No decreased range of motion.  No back pain.  Psych: No change in mood or affect. No depression or anxiety.  No memory loss.   Exam  General appearance :Awake, alert, not in any distress. Speech Clear. Not toxic Looking HEENT: Atraumatic and Normocephalic, pupils equally reactive to light and accomodation Neck: supple, no JVD. No cervical lymphadenopathy.  Chest:Good air entry bilaterally, no added sounds  CVS: S1 S2 regular, no murmurs.  Abdomen: Bowel sounds present, Non tender and not distended with no gaurding, rigidity or rebound. Extremities: B/L Lower Ext shows no edema, both legs are warm to touch Neurology: Awake alert, and oriented X 3, CN II-XII intact, Non focal Skin:No Rash Wounds:N/A    Data Review   CBC  Recent Labs Lab 10/04/14 1405  WBC 10.9*  HGB 16.1  HCT 43.8  PLT 209  MCV 82.2  MCH 30.2  MCHC 36.8*  RDW 12.9  LYMPHSABS 2.6  MONOABS 0.6  EOSABS 0.2  BASOSABS 0.0    Chemistries    Recent Labs Lab 10/04/14 1405  NA 128*  K 4.5  CL 88*  CO2 21  GLUCOSE 563*   BUN 15  CREATININE 0.75  CALCIUM 10.1   ------------------------------------------------------------------------------------------------------------------  Recent Labs  10/05/14 1423  HGBA1C 13.3   ------------------------------------------------------------------------------------------------------------------ No results for input(s): CHOL, HDL, LDLCALC, TRIG, CHOLHDL, LDLDIRECT in the last 72 hours. ------------------------------------------------------------------------------------------------------------------ No results for input(s): TSH, T4TOTAL, T3FREE, THYROIDAB in the last 72 hours.  Invalid input(s): FREET3 ------------------------------------------------------------------------------------------------------------------ No results for input(s): VITAMINB12, FOLATE, FERRITIN, TIBC, IRON, RETICCTPCT in the last 72 hours.  Coagulation profile  No results for input(s): INR, PROTIME in the last 168 hours.     Assessment & Plan  1. Type 2 diabetes mellitus with complication - POCT glycosylated hemoglobin (Hb A1C) significantly elevated at 13.3!! Suspect he is mild hyperglycemic hyperosmolar state, I have recommended hospitalization, however patient refuses. He is awake/alert and fully oriented.He understands life threatening risks of uncontrolled hyperglycemia. I will give him 1 litre of IVF, 20 units of insulin and then start him on Lantus 30 units QHS, 8 units of Novolog with meals. I will continue his oral medications that he received from the ED yesterday-Metformin and Glipizide. Patient has used insulin in the past, and is well versed in regards to injections. He is also aware of hypoglycemic and its life threatening and life disabling effects. He was educated regarding interventions that one needs to do when hypoglycemic.He will [facility]16 with his CBG diary.  2. HTN:BP currently controlled, has not yet take his lisinopril-will hold for now  3.  Dyslipidemia:resume Pravachol-but maybe next visit. Will check LFT's today-follow up 11/16  Health Maintenance Will be done at subsequent visit.  Follow up on 11/16  The patient was given clear instructions to go to ER or return to medical center if symptoms don't improve, worsen or new problems develop. The patient verbalized understanding. The patient was told to call to get lab results if they haven't heard anything in the next week.

## 2014-10-05 NOTE — Progress Notes (Unsigned)
Pt presents to [facility] for follow up, hyperglycemia.

## 2014-10-10 ENCOUNTER — Ambulatory Visit: Payer: Self-pay

## 2014-10-10 ENCOUNTER — Ambulatory Visit: Payer: Self-pay | Attending: Internal Medicine | Admitting: Internal Medicine

## 2014-10-10 VITALS — BP 113/70 | HR 70 | Temp 98.2°F | Resp 16 | Ht 74.0 in | Wt 304.0 lb

## 2014-10-10 DIAGNOSIS — E1165 Type 2 diabetes mellitus with hyperglycemia: Secondary | ICD-10-CM | POA: Insufficient documentation

## 2014-10-10 DIAGNOSIS — K088 Other specified disorders of teeth and supporting structures: Secondary | ICD-10-CM | POA: Insufficient documentation

## 2014-10-10 DIAGNOSIS — E1149 Type 2 diabetes mellitus with other diabetic neurological complication: Secondary | ICD-10-CM

## 2014-10-10 DIAGNOSIS — K0889 Other specified disorders of teeth and supporting structures: Secondary | ICD-10-CM

## 2014-10-10 DIAGNOSIS — I10 Essential (primary) hypertension: Secondary | ICD-10-CM | POA: Insufficient documentation

## 2014-10-10 DIAGNOSIS — E785 Hyperlipidemia, unspecified: Secondary | ICD-10-CM | POA: Insufficient documentation

## 2014-10-10 DIAGNOSIS — Z794 Long term (current) use of insulin: Secondary | ICD-10-CM | POA: Insufficient documentation

## 2014-10-10 LAB — GLUCOSE, POCT (MANUAL RESULT ENTRY): POC Glucose: 325 mg/dl — AB (ref 70–99)

## 2014-10-10 MED ORDER — INSULIN GLARGINE 100 UNIT/ML SOLOSTAR PEN: 36.0000 [IU] | PEN_INJECTOR | Freq: Every day | SUBCUTANEOUS | Status: DC

## 2014-10-10 MED ORDER — INSULIN ASPART 100 UNIT/ML ~~LOC~~ SOLN: 12.0000 [IU] | Freq: Three times a day (TID) | SUBCUTANEOUS | Status: DC

## 2014-10-10 NOTE — Progress Notes (Signed)
Patient ID: Bradley Miller, male   DOB: [birth year], 42 y.o.   MRN: [id number]  Patient Demographics  Bradley Miller, is a 43 y.o. male  LPN:[id number]  RZN:[id number]  DOB - 09/23/1971  No chief complaint on file.       Subjective:   Bradley Miller today is PRESENTING for a follow-up visit on 10/10/2014  HPI: Patient is 43 y.o. male with past medical history of diabetes type 2 presenting to the clinic today for a follow-up visit.patient was last seen by this M.D. On 10/05/14, during that visit, patient was started on insulin. His A1cwas 13.3. Since starting insulin, he has no further polyuria and polydipsia.His vision is now markedly better. CBG diary review shows CBGs mostly in the 300's range. Patient has also has No headache, No chest pain, No abdominal pain,No Nausea, No new weakness tingling or numbness, No Cough or SOB.     Objective:     There were no vitals filed for this visit.   ALLERGIES:   Allergies  Allergen Reactions  . Morphine And Related Itching    PAST MEDICAL HISTORY: Past Medical History  Diagnosis Date  . Diabetes mellitus without complication     PAST SURGICAL HISTORY: Past Surgical History  Procedure Laterality Date  . Ankle fracture surgery      FAMILY HISTORY: No family history on file.  MEDICATIONS:   Medication List       This list is accurate as of: 10/10/14  2:10 PM.  Always use your most recent med list.               glipiZIDE 10 MG tablet  Commonly known as:  GLUCOTROL  Take 1 tablet (10 mg total) by mouth daily before breakfast.     glucose monitoring kit monitoring kit  1 each by Does not apply route 4 (four) times daily - after meals and at bedtime. 1 month Diabetic Testing Supplies for QAC-QHS accuchecks.     insulin aspart 100 UNIT/ML injection  Commonly known as:  NOVOLOG  Inject 8 Units into the skin 3 (three) times daily with meals. Before each meal 3 times a day, Ok to switch to PEN if approved.     Insulin  Glargine 100 UNIT/ML Solostar Pen  Commonly known as:  LANTUS SOLOSTAR  Inject 30 Units into the skin daily at 10 pm.     metFORMIN 500 MG tablet  Commonly known as:  GLUCOPHAGE  Take 1 tablet (500 mg total) by mouth 2 (two) times daily with a meal.     pravastatin 40 MG tablet  Commonly known as:  PRAVACHOL  Take 40 mg by mouth daily.        SOCIAL HISTORY:   reports that he has been smoking.  He does not have any smokeless tobacco history on file. He reports that he does not drink alcohol or use illicit drugs.  REVIEW OF SYSTEMS:  Constitutional:   No   Fevers, chills, fatigue.  HEENT:    No headaches, Sore throat,   Cardio-vascular: No chest pain,  Orthopnea, swelling in lower extremities, anasarca, palpitations  GI:  No abdominal pain, nausea, vomiting, diarrhea  Resp: No shortness of breath,  No coughing up of blood.No cough.No wheezing.  Skin:  no rash or lesions.  GU:  no dysuria, change in color of urine, no urgency or frequency.  No flank pain.  Musculoskeletal: No joint pain or swelling.  No decreased range of motion.  No back pain.  Psych:  No change in mood or affect. No depression or anxiety.  No memory loss.   Exam  General appearance :Awake, alert, not in any distress. Speech Clear. Not toxic Looking HEENT: Atraumatic and Normocephalic, pupils equally reactive to light and accomodation Neck: supple, no JVD. No cervical lymphadenopathy.  Chest:Good air entry bilaterally, no added sounds  CVS: S1 S2 regular, no murmurs.  Abdomen: Bowel sounds present, Non tender and not distended with no gaurding, rigidity or rebound. Extremities: B/L Lower Ext shows no edema, both legs are warm to touch Neurology: Awake alert, and oriented X 3, CN II-XII intact, Non focal Skin:No Rash Wounds:N/A    Data Review   CBC  Recent Labs Lab 10/04/14 1405  WBC 10.9*  HGB 16.1  HCT 43.8  PLT 209  MCV 82.2  MCH 30.2  MCHC 36.8*  RDW 12.9  LYMPHSABS 2.6   MONOABS 0.6  EOSABS 0.2  BASOSABS 0.0    Chemistries    Recent Labs Lab 10/04/14 1405 10/05/14 1516  NA 128*  --   K 4.5  --   CL 88*  --   CO2 21  --   GLUCOSE 563*  --   BUN 15  --   CREATININE 0.75  --   CALCIUM 10.1  --   AST  --  18  ALT  --  22  ALKPHOS  --  115  BILITOT  --  0.4   ------------------------------------------------------------------------------------------------------------------ No results for input(s): HGBA1C in the last 72 hours. ------------------------------------------------------------------------------------------------------------------ No results for input(s): CHOL, HDL, LDLCALC, TRIG, CHOLHDL, LDLDIRECT in the last 72 hours. ------------------------------------------------------------------------------------------------------------------ No results for input(s): TSH, T4TOTAL, T3FREE, THYROIDAB in the last 72 hours.  Invalid input(s): FREET3 ------------------------------------------------------------------------------------------------------------------ No results for input(s): VITAMINB12, FOLATE, FERRITIN, TIBC, IRON, RETICCTPCT in the last 72 hours.  Coagulation profile  No results for input(s): INR, PROTIME in the last 168 hours.     Assessment & Plan  Uncontrolled diabetes -claims that his sugars are mostly in the 300s range. Therefore will Increase Lantus to 36 units, and NovoLog to 12 units with meals. Have him follow-up in one month. I have asked him to bring CBG diary at next visit as well.Will need A1c rechecked 3 months  Dyslipidemia -Restart statin-LFTs within normal range. -Recheck lipid panel in 3-6 months  Hypertension: -blood pressure currently controlled, continue to hold lisinopril. Reassess next visit  Toothache - Ambulatory referral to Dentistryplaced   Follow up in 1 month  The patient was given clear instructions to go to ER or return to medical center if symptoms don't improve, worsen or new problems  develop. The patient verbalized understanding. The patient was told to call to get lab results if they haven't heard anything in the next week.

## 2014-10-10 NOTE — Progress Notes (Signed)
Pt comes in for CBG repeat with medication management Pt is taking 30 units Lantus @ bedtime,Sliding scale Home monitoring with log CBG- 325, just finished eating with injection Denies blurred vision,dizziness or freq urination  BP- 113/70 70

## 2014-10-11 ENCOUNTER — Ambulatory Visit: Payer: Self-pay | Attending: Internal Medicine

## 2014-10-26 ENCOUNTER — Other Ambulatory Visit: Payer: Self-pay | Admitting: Internal Medicine

## 2014-10-26 MED ORDER — INSULIN GLARGINE 100 UNIT/ML SOLOSTAR PEN: 36.0000 [IU] | PEN_INJECTOR | Freq: Every day | SUBCUTANEOUS | Status: DC

## 2014-10-26 MED ORDER — INSULIN ASPART 100 UNIT/ML ~~LOC~~ SOLN: 12.0000 [IU] | Freq: Three times a day (TID) | SUBCUTANEOUS | Status: DC

## 2017-03-22 DIAGNOSIS — R112 Nausea with vomiting, unspecified: Secondary | ICD-10-CM

## 2017-03-22 DIAGNOSIS — F419 Anxiety disorder, unspecified: Secondary | ICD-10-CM

## 2017-03-22 DIAGNOSIS — Z720 Tobacco use: Secondary | ICD-10-CM

## 2017-03-22 DIAGNOSIS — E1110 Type 2 diabetes mellitus with ketoacidosis without coma: Secondary | ICD-10-CM

## 2017-03-22 DIAGNOSIS — Z9119 Patient's noncompliance with other medical treatment and regimen: Secondary | ICD-10-CM

## 2017-12-01 DIAGNOSIS — E1165 Type 2 diabetes mellitus with hyperglycemia: Secondary | ICD-10-CM | POA: Diagnosis not present

## 2017-12-01 DIAGNOSIS — Z79899 Other long term (current) drug therapy: Secondary | ICD-10-CM | POA: Diagnosis not present

## 2017-12-01 DIAGNOSIS — I10 Essential (primary) hypertension: Secondary | ICD-10-CM | POA: Diagnosis not present

## 2017-12-01 DIAGNOSIS — E785 Hyperlipidemia, unspecified: Secondary | ICD-10-CM | POA: Diagnosis not present

## 2018-05-05 DIAGNOSIS — E785 Hyperlipidemia, unspecified: Secondary | ICD-10-CM | POA: Diagnosis not present

## 2018-05-05 DIAGNOSIS — E1165 Type 2 diabetes mellitus with hyperglycemia: Secondary | ICD-10-CM | POA: Diagnosis not present

## 2018-05-05 DIAGNOSIS — R12 Heartburn: Secondary | ICD-10-CM | POA: Diagnosis not present

## 2018-05-05 DIAGNOSIS — I10 Essential (primary) hypertension: Secondary | ICD-10-CM | POA: Diagnosis not present

## 2018-05-05 DIAGNOSIS — Z79899 Other long term (current) drug therapy: Secondary | ICD-10-CM | POA: Diagnosis not present

## 2018-07-24 DIAGNOSIS — E1165 Type 2 diabetes mellitus with hyperglycemia: Secondary | ICD-10-CM | POA: Diagnosis not present

## 2018-07-24 DIAGNOSIS — I10 Essential (primary) hypertension: Secondary | ICD-10-CM | POA: Diagnosis not present

## 2018-07-24 DIAGNOSIS — F988 Other specified behavioral and emotional disorders with onset usually occurring in childhood and adolescence: Secondary | ICD-10-CM | POA: Diagnosis not present

## 2018-07-24 DIAGNOSIS — R12 Heartburn: Secondary | ICD-10-CM | POA: Diagnosis not present

## 2018-08-24 DIAGNOSIS — Z79899 Other long term (current) drug therapy: Secondary | ICD-10-CM | POA: Diagnosis not present

## 2018-08-24 DIAGNOSIS — E785 Hyperlipidemia, unspecified: Secondary | ICD-10-CM | POA: Diagnosis not present

## 2018-08-24 DIAGNOSIS — R12 Heartburn: Secondary | ICD-10-CM | POA: Diagnosis not present

## 2018-08-24 DIAGNOSIS — I10 Essential (primary) hypertension: Secondary | ICD-10-CM | POA: Diagnosis not present

## 2018-08-24 DIAGNOSIS — E1165 Type 2 diabetes mellitus with hyperglycemia: Secondary | ICD-10-CM | POA: Diagnosis not present

## 2018-09-01 DIAGNOSIS — R112 Nausea with vomiting, unspecified: Secondary | ICD-10-CM | POA: Diagnosis not present

## 2018-09-01 DIAGNOSIS — Z1211 Encounter for screening for malignant neoplasm of colon: Secondary | ICD-10-CM | POA: Diagnosis not present

## 2018-09-01 DIAGNOSIS — R1032 Left lower quadrant pain: Secondary | ICD-10-CM | POA: Diagnosis not present

## 2018-09-01 DIAGNOSIS — R197 Diarrhea, unspecified: Secondary | ICD-10-CM | POA: Diagnosis not present

## 2018-09-16 DIAGNOSIS — R112 Nausea with vomiting, unspecified: Secondary | ICD-10-CM | POA: Diagnosis not present

## 2018-09-16 DIAGNOSIS — K621 Rectal polyp: Secondary | ICD-10-CM | POA: Diagnosis not present

## 2018-09-16 DIAGNOSIS — D128 Benign neoplasm of rectum: Secondary | ICD-10-CM | POA: Diagnosis not present

## 2018-09-16 DIAGNOSIS — K2950 Unspecified chronic gastritis without bleeding: Secondary | ICD-10-CM | POA: Diagnosis not present

## 2018-09-16 DIAGNOSIS — Z1211 Encounter for screening for malignant neoplasm of colon: Secondary | ICD-10-CM | POA: Diagnosis not present

## 2018-09-23 DIAGNOSIS — R109 Unspecified abdominal pain: Secondary | ICD-10-CM | POA: Diagnosis not present

## 2018-09-23 DIAGNOSIS — R1032 Left lower quadrant pain: Secondary | ICD-10-CM | POA: Diagnosis not present

## 2018-09-23 DIAGNOSIS — R112 Nausea with vomiting, unspecified: Secondary | ICD-10-CM | POA: Diagnosis not present

## 2018-11-09 DIAGNOSIS — E119 Type 2 diabetes mellitus without complications: Secondary | ICD-10-CM | POA: Diagnosis not present

## 2018-11-16 DIAGNOSIS — Z6829 Body mass index (BMI) 29.0-29.9, adult: Secondary | ICD-10-CM | POA: Diagnosis not present

## 2018-11-16 DIAGNOSIS — G510 Bell's palsy: Secondary | ICD-10-CM | POA: Diagnosis not present

## 2018-11-30 DIAGNOSIS — R41840 Attention and concentration deficit: Secondary | ICD-10-CM | POA: Diagnosis not present

## 2018-11-30 DIAGNOSIS — E785 Hyperlipidemia, unspecified: Secondary | ICD-10-CM | POA: Diagnosis not present

## 2018-11-30 DIAGNOSIS — Z1331 Encounter for screening for depression: Secondary | ICD-10-CM | POA: Diagnosis not present

## 2018-11-30 DIAGNOSIS — I10 Essential (primary) hypertension: Secondary | ICD-10-CM | POA: Diagnosis not present

## 2018-11-30 DIAGNOSIS — Z79899 Other long term (current) drug therapy: Secondary | ICD-10-CM | POA: Diagnosis not present

## 2018-11-30 DIAGNOSIS — E1165 Type 2 diabetes mellitus with hyperglycemia: Secondary | ICD-10-CM | POA: Diagnosis not present

## 2018-12-11 ENCOUNTER — Ambulatory Visit (INDEPENDENT_AMBULATORY_CARE_PROVIDER_SITE_OTHER): Payer: Self-pay

## 2018-12-11 ENCOUNTER — Other Ambulatory Visit: Payer: Self-pay | Admitting: Gerontology

## 2018-12-11 DIAGNOSIS — T148XXA Other injury of unspecified body region, initial encounter: Secondary | ICD-10-CM

## 2018-12-11 DIAGNOSIS — X58XXXA Exposure to other specified factors, initial encounter: Secondary | ICD-10-CM

## 2018-12-11 DIAGNOSIS — S67192A Crushing injury of right middle finger, initial encounter: Secondary | ICD-10-CM

## 2019-01-12 DIAGNOSIS — H25811 Combined forms of age-related cataract, right eye: Secondary | ICD-10-CM | POA: Diagnosis not present

## 2019-01-12 DIAGNOSIS — Z01818 Encounter for other preprocedural examination: Secondary | ICD-10-CM | POA: Diagnosis not present

## 2019-01-19 DIAGNOSIS — Z79899 Other long term (current) drug therapy: Secondary | ICD-10-CM | POA: Diagnosis not present

## 2019-01-19 DIAGNOSIS — E1140 Type 2 diabetes mellitus with diabetic neuropathy, unspecified: Secondary | ICD-10-CM | POA: Diagnosis not present

## 2019-01-19 DIAGNOSIS — Z7984 Long term (current) use of oral hypoglycemic drugs: Secondary | ICD-10-CM | POA: Diagnosis not present

## 2019-01-19 DIAGNOSIS — E1136 Type 2 diabetes mellitus with diabetic cataract: Secondary | ICD-10-CM | POA: Diagnosis not present

## 2019-01-19 DIAGNOSIS — H25811 Combined forms of age-related cataract, right eye: Secondary | ICD-10-CM | POA: Diagnosis not present

## 2019-01-19 DIAGNOSIS — E785 Hyperlipidemia, unspecified: Secondary | ICD-10-CM | POA: Diagnosis not present

## 2019-01-19 DIAGNOSIS — H259 Unspecified age-related cataract: Secondary | ICD-10-CM | POA: Diagnosis not present

## 2019-01-19 DIAGNOSIS — F17210 Nicotine dependence, cigarettes, uncomplicated: Secondary | ICD-10-CM | POA: Diagnosis not present

## 2019-01-19 DIAGNOSIS — I10 Essential (primary) hypertension: Secondary | ICD-10-CM | POA: Diagnosis not present

## 2019-03-01 DIAGNOSIS — R12 Heartburn: Secondary | ICD-10-CM | POA: Diagnosis not present

## 2019-03-01 DIAGNOSIS — I10 Essential (primary) hypertension: Secondary | ICD-10-CM | POA: Diagnosis not present

## 2019-03-01 DIAGNOSIS — E1165 Type 2 diabetes mellitus with hyperglycemia: Secondary | ICD-10-CM | POA: Diagnosis not present

## 2019-03-01 DIAGNOSIS — E785 Hyperlipidemia, unspecified: Secondary | ICD-10-CM | POA: Diagnosis not present

## 2019-03-01 DIAGNOSIS — Z79899 Other long term (current) drug therapy: Secondary | ICD-10-CM | POA: Diagnosis not present

## 2019-09-08 DIAGNOSIS — H25042 Posterior subcapsular polar age-related cataract, left eye: Secondary | ICD-10-CM | POA: Diagnosis not present

## 2019-10-05 DIAGNOSIS — R739 Hyperglycemia, unspecified: Secondary | ICD-10-CM | POA: Diagnosis not present

## 2019-10-06 IMAGING — DX DG HAND COMPLETE 3+V*R*
3 series · 3 of 3 positions shown · non-contrast
Comparison: None.

CLINICAL DATA: Crush injury pain second and third fingers.

EXAM:
RIGHT HAND - COMPLETE 3+ VIEW

[hand pa]
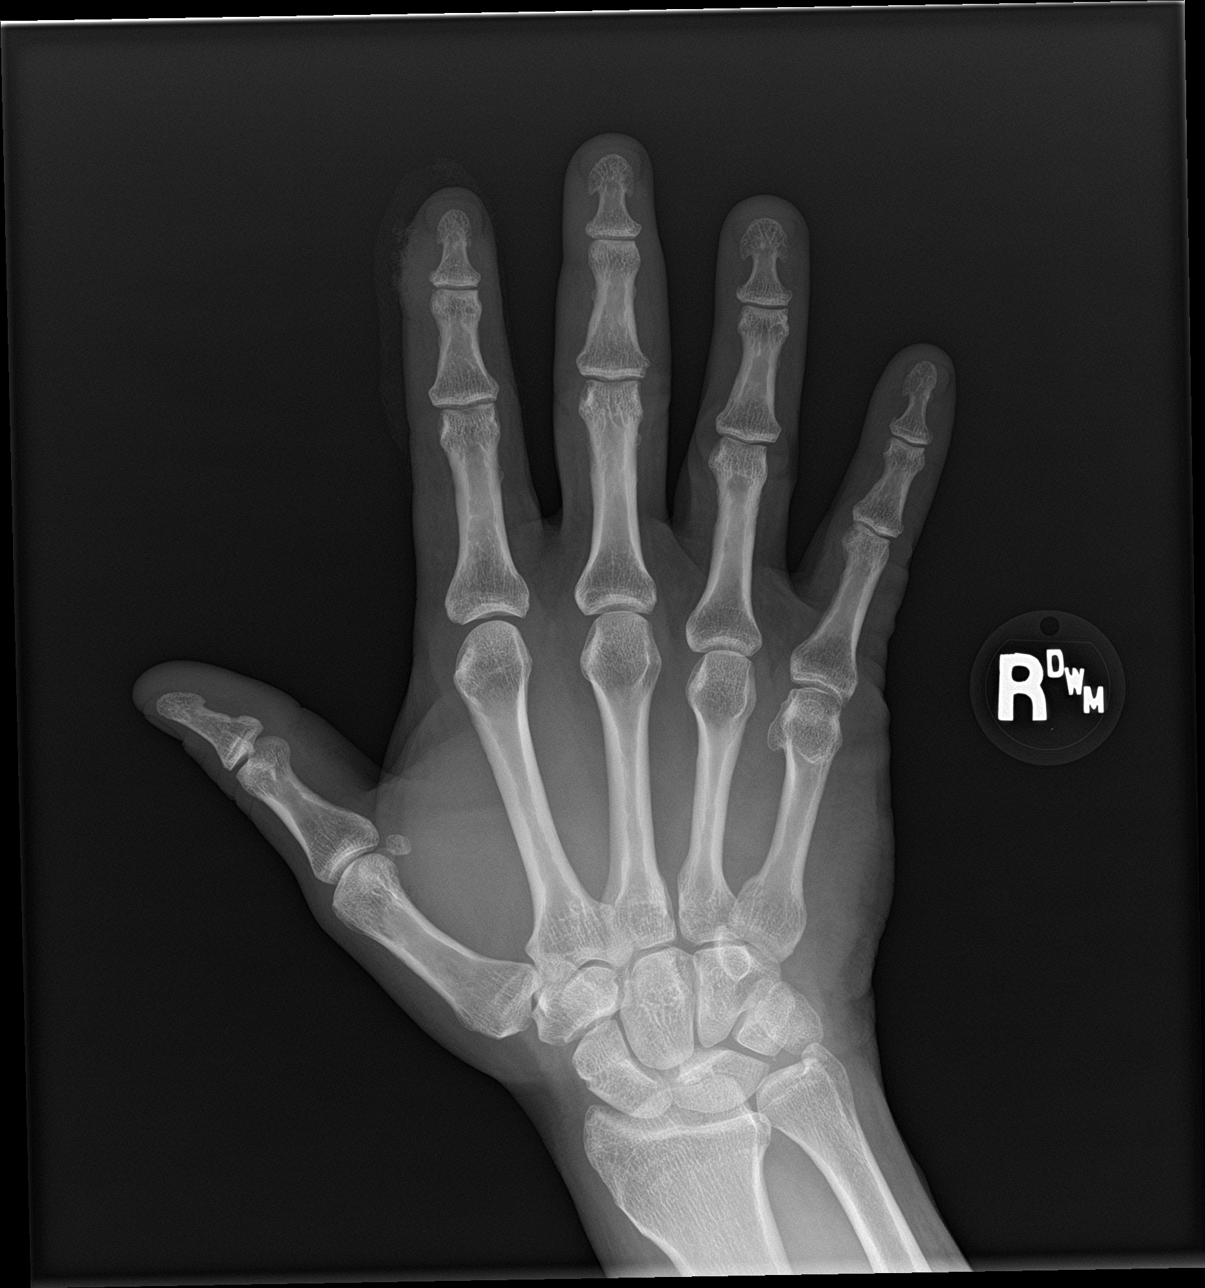

[hand obl]
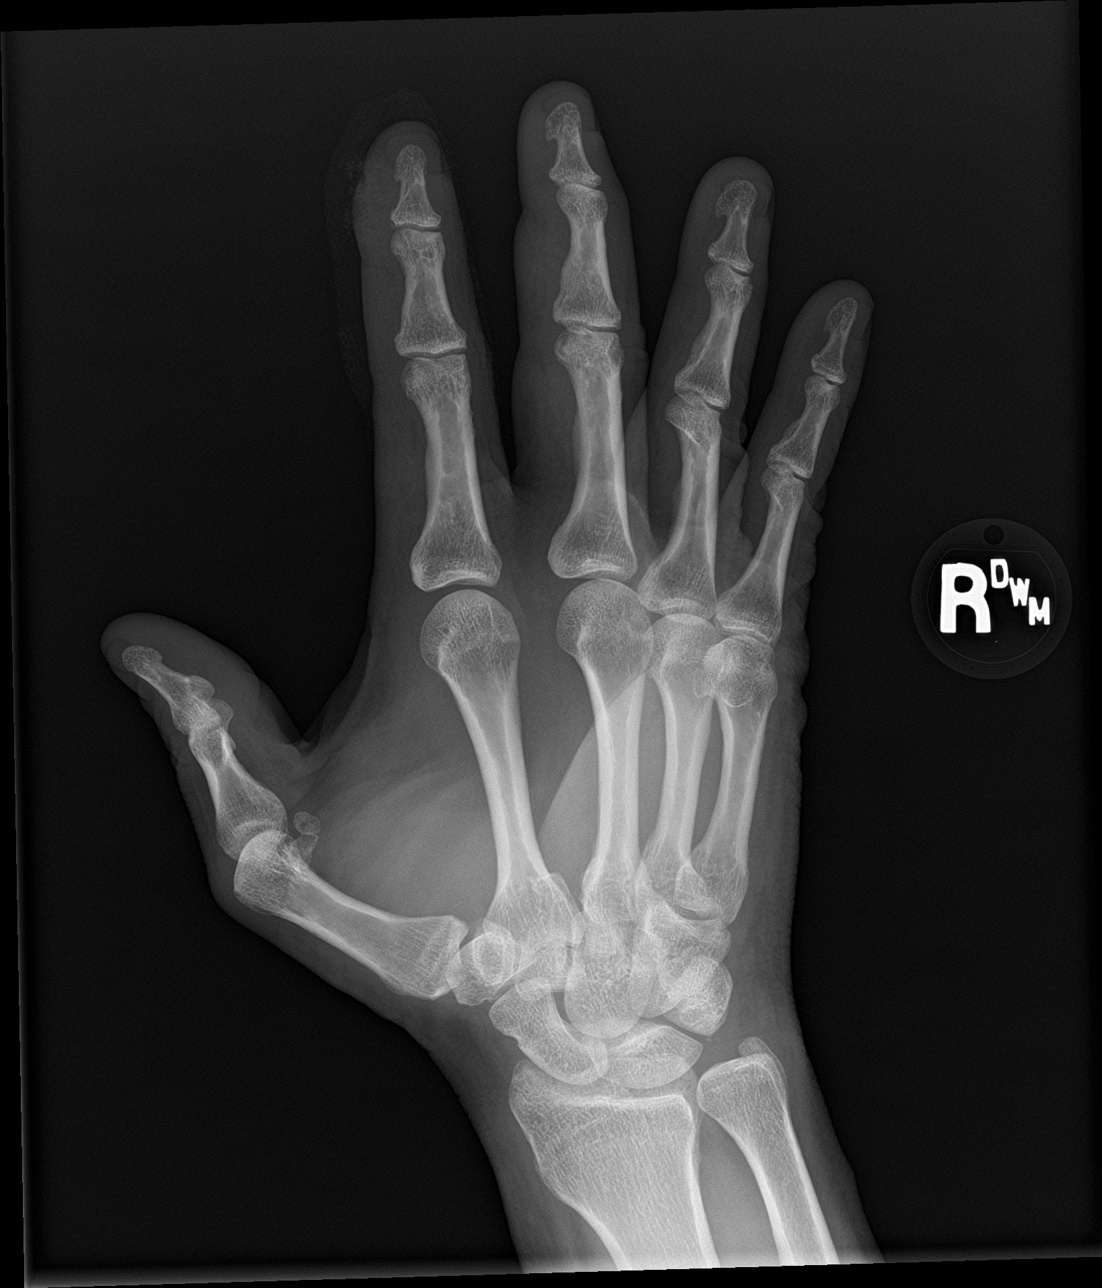

[hand lat]
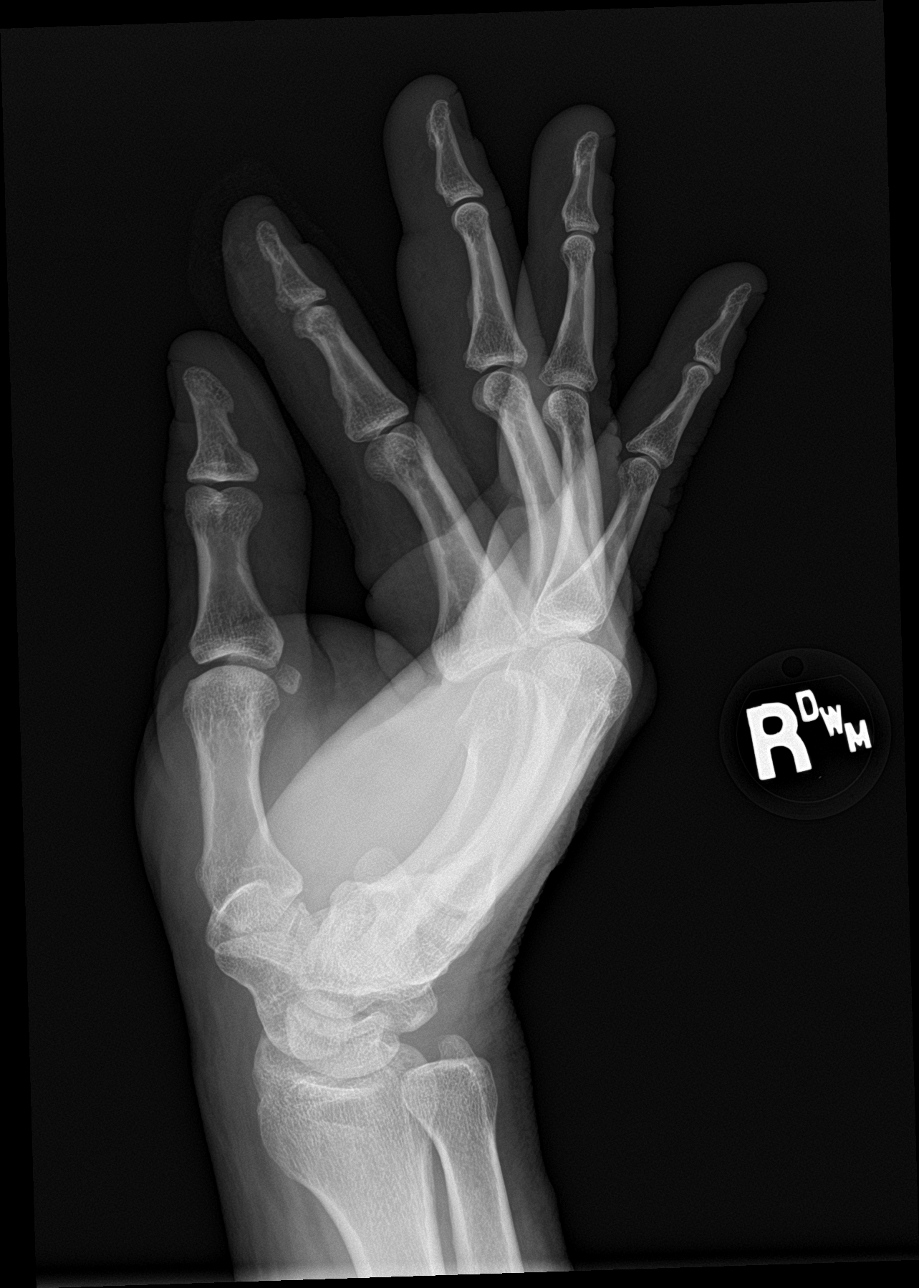

[3 of 3 positions shown; findings below may reference images not displayed]

FINDINGS: Negative for fracture. No arthropathy. Soft tissue injury distal
first finger.
IMPRESSION: Negative for fracture.

## 2019-11-03 DIAGNOSIS — I10 Essential (primary) hypertension: Secondary | ICD-10-CM | POA: Diagnosis not present

## 2019-11-03 DIAGNOSIS — E785 Hyperlipidemia, unspecified: Secondary | ICD-10-CM | POA: Diagnosis not present

## 2019-11-03 DIAGNOSIS — E1165 Type 2 diabetes mellitus with hyperglycemia: Secondary | ICD-10-CM | POA: Diagnosis not present

## 2019-11-03 DIAGNOSIS — Z79899 Other long term (current) drug therapy: Secondary | ICD-10-CM | POA: Diagnosis not present

## 2019-11-03 DIAGNOSIS — R12 Heartburn: Secondary | ICD-10-CM | POA: Diagnosis not present

## 2019-12-07 DIAGNOSIS — J029 Acute pharyngitis, unspecified: Secondary | ICD-10-CM | POA: Diagnosis not present

## 2019-12-07 DIAGNOSIS — E1165 Type 2 diabetes mellitus with hyperglycemia: Secondary | ICD-10-CM | POA: Diagnosis not present

## 2020-05-24 DIAGNOSIS — E119 Type 2 diabetes mellitus without complications: Secondary | ICD-10-CM | POA: Diagnosis not present

## 2020-06-30 ENCOUNTER — Ambulatory Visit (HOSPITAL_COMMUNITY)
Admission: EM | Admit: 2020-06-30 | Discharge: 2020-06-30 | Disposition: A | Discharge status: Home / Self Care | Payer: BC Managed Care – PPO | Attending: Family Medicine | Admitting: Family Medicine

## 2020-06-30 ENCOUNTER — Encounter (HOSPITAL_COMMUNITY): Payer: Self-pay

## 2020-06-30 ENCOUNTER — Other Ambulatory Visit: Payer: Self-pay

## 2020-06-30 DIAGNOSIS — K0889 Other specified disorders of teeth and supporting structures: Secondary | ICD-10-CM | POA: Diagnosis not present

## 2020-06-30 MED ORDER — HYDROCODONE-ACETAMINOPHEN 7.5-325 MG PO TABS: 1.0000 | ORAL_TABLET | Freq: Four times a day (QID) | ORAL | 0 refills | Status: DC | PRN

## 2020-06-30 NOTE — Discharge Instructions (Signed)
Take your antibiotic as prescribed Take pain medication as needed Do not take pain medication and drive If the pain medicine causes itching, take an antihistamine like Zyrtec, Claritin, or Benadryl See dentist on Monday

## 2020-06-30 NOTE — ED Triage Notes (Signed)
Pt presents with dental pain x 3 days. States his dentist sent amoxicillin today, he has not started yet. Pt has a dentist appt Monday 9.

## 2020-06-30 NOTE — ED Provider Notes (Signed)
[facility]    CSN: [id number] Arrival date & time: 06/30/20  1121      History   Chief Complaint Chief Complaint  Patient presents with   Dental Pain    HPI Bradley Miller is a 49 y.o. male.   HPI  Patient has poor dentition.  He has a Pharmacist, community.  He states that he does have an appointment for Monday, 07/03/2020.  He has severe dental pain.  He called his dentist.  The dentist was willing to call in amoxicillin.  Patient is here hoping for pain management. He is taking ibuprofen 800 mg.  Acetaminophen 1000 mg.  Even with both of these medication he is not getting good pain relief. No fever or chills  Past Medical History:  Diagnosis Date   Diabetes mellitus without complication ([place])     There are no problems to display for this patient.   Past Surgical History:  Procedure Laterality Date   ANKLE FRACTURE SURGERY         Home Medications    Prior to Admission medications   Medication Sig Start Date End Date Taking? Authorizing Provider  glipiZIDE (GLUCOTROL) 10 MG tablet Take 1 tablet (10 mg total) by mouth daily before breakfast. 10/05/14   Ghimire, Henreitta Leber, MD  glucose monitoring kit (FREESTYLE) monitoring kit 1 each by Does not apply route 4 (four) times daily - after meals and at bedtime. 1 month Diabetic Testing Supplies for QAC-QHS accuchecks. 10/05/14   Ghimire, Henreitta Leber, MD  HYDROcodone-acetaminophen (NORCO) 7.5-325 MG tablet Take 1 tablet by mouth every 6 (six) hours as needed for moderate pain. 06/30/20   Raylene Everts, MD  insulin aspart (NOVOLOG) 100 UNIT/ML injection Inject 12 Units into the skin 3 (three) times daily with meals. Before each meal 3 times a day, Ok to switch to PEN if approved. 10/26/14   Tresa Garter, MD  Insulin Glargine (LANTUS SOLOSTAR) 100 UNIT/ML Solostar Pen Inject 36 Units into the skin daily at 10 pm. 10/26/14   Tresa Garter, MD  metFORMIN (GLUCOPHAGE) 500 MG tablet Take 1 tablet (500 mg total) by  mouth 2 (two) times daily with a meal. 10/05/14   Ghimire, Henreitta Leber, MD  pravastatin (PRAVACHOL) 40 MG tablet Take 40 mg by mouth daily.    [provider]    Family History History reviewed. No pertinent family history.  Social History Social History   Tobacco Use   Smoking status: Current Every Day Smoker   Smokeless tobacco: Never Used  Substance Use Topics   Alcohol use: No   Drug use: No     Allergies   Morphine   Review of Systems Review of Systems See HPI  Physical Exam Triage Vital Signs ED Triage Vitals  Enc Vitals Group     BP 06/30/20 1240 114/78     Pulse Rate 06/30/20 1240 75     Resp 06/30/20 1240 (!) 21     Temp 06/30/20 1240 97.8 F (36.6 C)     Temp src --      SpO2 06/30/20 1240 99 %     Weight --      Height --      Head Circumference --      Peak Flow --      Pain Score 06/30/20 1239 8     Pain Loc --      Pain Edu? --      Excl. in GC? --    No  data found.  Updated Vital Signs BP 114/78 (BP Location: Right Arm)    Pulse 75    Temp 97.8 F (36.6 C)    Resp (!) 21    SpO2 99%      Physical Exam Constitutional:      General: He is not in acute distress.    Appearance: He is well-developed and normal weight.     Comments: Appears to be in acute pain  HENT:     Head: Normocephalic and atraumatic.     Mouth/Throat:     Comments: Dentition is in very poor repair, caries, fractures, broken and missing teeth, periodontal disease Eyes:     Conjunctiva/sclera: Conjunctivae normal.     Pupils: Pupils are equal, round, and reactive to light.  Cardiovascular:     Rate and Rhythm: Normal rate.  Pulmonary:     Effort: Pulmonary effort is normal. No respiratory distress.  Abdominal:     General: There is no distension.     Palpations: Abdomen is soft.  Musculoskeletal:        General: Normal range of motion.     Cervical back: Normal range of motion.  Skin:    General: Skin is warm and dry.  Neurological:     Mental  Status: He is alert.  Psychiatric:        Mood and Affect: Mood normal.        Behavior: Behavior normal.      UC Treatments / Results  Labs (all labs ordered are listed, but only abnormal results are displayed) Labs Reviewed - No data to display  EKG   Radiology No results found.  Procedures Procedures (including critical care time)  Medications Ordered in UC Medications - No data to display  Initial Impression / Assessment and Plan / UC Course  I have reviewed the triage vital signs and the nursing notes.  Pertinent labs & imaging results that were available during my care of the patient were reviewed by me and considered in my medical decision making (see chart for details).     Emphasized the importance of good dental follow-up Final Clinical Impressions(s) / UC Diagnoses   Final diagnoses:  Pain, dental     Discharge Instructions     Take your antibiotic as prescribed Take pain medication as needed Do not take pain medication and drive If the pain medicine causes itching, take an antihistamine like Zyrtec, Claritin, or Benadryl See dentist on Monday   ED Prescriptions    Medication Sig Dispense Auth. Provider   HYDROcodone-acetaminophen (NORCO) 7.5-325 MG tablet Take 1 tablet by mouth every 6 (six) hours as needed for moderate pain. 15 tablet Raylene Everts, MD     I have reviewed the PDMP during this encounter.   Raylene Everts, MD 06/30/20 1400

## 2020-07-05 DIAGNOSIS — Z20822 Contact with and (suspected) exposure to covid-19: Secondary | ICD-10-CM | POA: Diagnosis not present

## 2020-07-05 DIAGNOSIS — R197 Diarrhea, unspecified: Secondary | ICD-10-CM | POA: Diagnosis not present

## 2020-07-05 DIAGNOSIS — R112 Nausea with vomiting, unspecified: Secondary | ICD-10-CM | POA: Diagnosis not present

## 2020-07-05 DIAGNOSIS — Z7984 Long term (current) use of oral hypoglycemic drugs: Secondary | ICD-10-CM | POA: Diagnosis not present

## 2020-07-05 DIAGNOSIS — E1165 Type 2 diabetes mellitus with hyperglycemia: Secondary | ICD-10-CM | POA: Diagnosis not present

## 2020-07-05 DIAGNOSIS — Z79899 Other long term (current) drug therapy: Secondary | ICD-10-CM | POA: Diagnosis not present

## 2020-08-29 DIAGNOSIS — Z794 Long term (current) use of insulin: Secondary | ICD-10-CM | POA: Diagnosis not present

## 2020-08-29 DIAGNOSIS — E1165 Type 2 diabetes mellitus with hyperglycemia: Secondary | ICD-10-CM | POA: Diagnosis not present

## 2021-09-01 ENCOUNTER — Ambulatory Visit
Admission: EM | Admit: 2021-09-01 | Discharge: 2021-09-01 | Disposition: A | Discharge status: Home / Self Care | Payer: BC Managed Care – PPO | Attending: Family Medicine | Admitting: Family Medicine

## 2021-09-01 ENCOUNTER — Encounter: Payer: Self-pay | Admitting: Emergency Medicine

## 2021-09-01 DIAGNOSIS — E1165 Type 2 diabetes mellitus with hyperglycemia: Secondary | ICD-10-CM

## 2021-09-01 LAB — POCT FASTING CBG KUC MANUAL ENTRY: POCT Glucose (KUC): 600 mg/dL — AB (ref 70–99)

## 2021-09-01 NOTE — ED Notes (Signed)
Pt called by [facility], pt left premises. Not in waiting room x 1.

## 2021-09-01 NOTE — ED Provider Notes (Signed)
  [facility]   [id number] 09/01/21 Arrival Time: 1128  ASSESSMENT & PLAN:  1. Uncontrolled type 2 diabetes mellitus with hyperglycemia (HCC)    Labs Reviewed  POCT FASTING CBG [facility] MANUAL ENTRY - Abnormal; Notable for the following components:      Result Value   POCT Glucose ([facility]) 600 (*)    All other components within normal limits   To ED for further evaluation. By private vehicle. Stable upon discharge.    Follow-up Information     Go to  [facility] EMERGENCY DEPARTMENT.   Specialty: Emergency Medicine Contact information: [address] [id number] [place] [address] [phone]                Reviewed expectations re: course of current medical issues. Questions answered. Outlined signs and symptoms indicating need for more acute intervention. Patient verbalized understanding. After Visit Summary given.   SUBJECTIVE: History from: patient. Bradley Miller is a 50 y.o. male who reports being out of DM medications; 3 months; persistently high blood sugars; 344 this morning upon waking. Fatigued. Bilateral LE paresthesias. Occas blurry vision. Tolerating PO intake without n/v/d. No abd pain. Ambulatory. Afebrile. No recent illnesses.  OBJECTIVE:  Vitals:   09/01/21 1335 09/01/21 1336  BP: 121/81   Pulse: 90   Resp: 18   Temp: (!) 97.3 F (36.3 C)   TempSrc: Oral   SpO2: 98%   Weight:  90.7 kg  Height:  6\' 2"  (1.88 m)    General appearance: alert Eyes: PERRLA; EOMI; conjunctiva normal HENT: normocephalic; atraumatic Lungs: unlabored Heart: regular Abdomen: soft Extremities: no edema; symmetrical with no gross deformities Skin: warm and dry Neurologic: normal gait Psychological: alert and cooperative; normal mood and affect  Labs: Results for orders placed or performed during the hospital encounter of 09/01/21  POCT CBG (manual entry)  Result Value Ref Range   POCT Glucose ([facility]) 600 (A) 70 - 99 mg/dL   Labs  Reviewed  POCT FASTING CBG [facility] MANUAL ENTRY - Abnormal; Notable for the following components:      Result Value   POCT Glucose ([facility]) 600 (*)    All other components within normal limits     Allergies  Allergen Reactions   Morphine Itching    Side effect    Past Medical History:  Diagnosis Date   Diabetes mellitus without complication (HCC)    Social History   Socioeconomic History   Marital status: Divorced    Spouse name: Not on file   Number of children: Not on file   Years of education: Not on file   Highest education level: Not on file  Occupational History   Not on file  Tobacco Use   Smoking status: Every Day   Smokeless tobacco: Never  Substance and Sexual Activity   Alcohol use: No   Drug use: No   Sexual activity: Not on file  Other Topics Concern   Not on file  Social History Narrative   Not on file   Social Determinants of Health   Financial Resource Strain: Not on file  Food Insecurity: Not on file  Transportation Needs: Not on file  Physical Activity: Not on file  Stress: Not on file  Social Connections: Not on file  Intimate Partner Violence: Not on file   History reviewed. No pertinent family history. Past Surgical History:  Procedure Laterality Date   ANKLE FRACTURE SURGERY       11/01/21, MD 09/01/21 1414

## 2021-09-01 NOTE — ED Triage Notes (Signed)
Pt presents today with concern of elevated blood sugar x 3 months. He reports that he has been out of his blood sugar medications x 3 months. Also, reports pain/tingling to BLE. Does not have PCP.

## 2021-09-01 NOTE — ED Notes (Signed)
Patient is being discharged from the Urgent Care and sent to the Emergency Department via Personal Vehicle with significant other . Per Dr.Hagler, patient is in need of higher level of care due to Hyperglycemia. Patient is aware and verbalizes understanding of plan of care.  Vitals:   09/01/21 1335  BP: 121/81  Pulse: 90  Resp: 18  Temp: (!) 97.3 F (36.3 C)  SpO2: 98%

## 2022-01-03 DIAGNOSIS — Z1159 Encounter for screening for other viral diseases: Secondary | ICD-10-CM | POA: Diagnosis not present

## 2022-01-03 DIAGNOSIS — E782 Mixed hyperlipidemia: Secondary | ICD-10-CM | POA: Diagnosis not present

## 2022-01-03 DIAGNOSIS — B351 Tinea unguium: Secondary | ICD-10-CM | POA: Diagnosis not present

## 2022-01-03 DIAGNOSIS — Z125 Encounter for screening for malignant neoplasm of prostate: Secondary | ICD-10-CM | POA: Diagnosis not present

## 2022-01-03 DIAGNOSIS — Z794 Long term (current) use of insulin: Secondary | ICD-10-CM | POA: Diagnosis not present

## 2022-01-03 DIAGNOSIS — E1165 Type 2 diabetes mellitus with hyperglycemia: Secondary | ICD-10-CM | POA: Diagnosis not present

## 2022-01-03 DIAGNOSIS — R5383 Other fatigue: Secondary | ICD-10-CM | POA: Diagnosis not present

## 2022-01-03 DIAGNOSIS — Z91199 Patient's noncompliance with other medical treatment and regimen due to unspecified reason: Secondary | ICD-10-CM | POA: Diagnosis not present

## 2022-01-03 LAB — COMPREHENSIVE METABOLIC PANEL: eGFR: 104

## 2022-01-03 LAB — LIPID PANEL
LDL Cholesterol: 107
Triglycerides: 373 — AB (ref 40–160)

## 2022-01-03 LAB — BASIC METABOLIC PANEL WITH GFR
BUN: 15 (ref 4–21)
Creatinine: 0.9 (ref 0.6–1.3)

## 2022-01-05 LAB — HEMOGLOBIN A1C: Hemoglobin A1C: 14.8

## 2022-01-29 ENCOUNTER — Other Ambulatory Visit: Payer: Self-pay

## 2022-01-29 ENCOUNTER — Ambulatory Visit (INDEPENDENT_AMBULATORY_CARE_PROVIDER_SITE_OTHER): Payer: BC Managed Care – PPO | Admitting: Nurse Practitioner

## 2022-01-29 ENCOUNTER — Encounter: Payer: Self-pay | Admitting: Nurse Practitioner

## 2022-01-29 VITALS — BP 116/74 | HR 65 | Ht 71.5 in | Wt 209.0 lb

## 2022-01-29 DIAGNOSIS — Z794 Long term (current) use of insulin: Secondary | ICD-10-CM

## 2022-01-29 DIAGNOSIS — E782 Mixed hyperlipidemia: Secondary | ICD-10-CM | POA: Diagnosis not present

## 2022-01-29 DIAGNOSIS — E1165 Type 2 diabetes mellitus with hyperglycemia: Secondary | ICD-10-CM

## 2022-01-29 DIAGNOSIS — I10 Essential (primary) hypertension: Secondary | ICD-10-CM

## 2022-01-29 MED ORDER — GLIPIZIDE 10 MG PO TABS: 5.0000 mg | ORAL_TABLET | Freq: Every day | ORAL | 3 refills | Status: DC

## 2022-01-29 NOTE — Patient Instructions (Signed)

## 2022-01-29 NOTE — Progress Notes (Signed)
Endocrinology Consult Note       01/29/2022, 11:41 AM   Subjective:    Patient ID: Bradley Miller, male    DOB: [birth year].  Bradley Miller is being seen in consultation for management of currently uncontrolled symptomatic diabetes requested by  Wilburt Finlay, MD.   Past Medical History:  Diagnosis Date   Diabetes mellitus without complication [facility])     Past Surgical History:  Procedure Laterality Date   ANKLE FRACTURE SURGERY      Social History   Socioeconomic History   Marital status: Divorced    Spouse name: Not on file   Number of children: Not on file   Years of education: Not on file   Highest education level: Not on file  Occupational History   Not on file  Tobacco Use   Smoking status: Former    Types: Cigarettes    Passive exposure: Past   Smokeless tobacco: Former  Scientific laboratory technician Use: Former  Substance and Sexual Activity   Alcohol use: Yes    Comment: occ   Drug use: No   Sexual activity: Not on file  Other Topics Concern   Not on file  Social History Narrative   Not on file   Social Determinants of Health   Financial Resource Strain: Not on file  Food Insecurity: Not on file  Transportation Needs: Not on file  Physical Activity: Not on file  Stress: Not on file  Social Connections: Not on file    History reviewed. No pertinent family history.  Outpatient Encounter Medications as of 01/29/2022  Medication Sig   aspirin 81 MG EC tablet Take 1 tablet by mouth daily.   gabapentin (NEURONTIN) 100 MG capsule Take 100 mg by mouth 3 (three) times daily.   Insulin Glargine (LANTUS SOLOSTAR) 100 UNIT/ML Solostar Pen Inject 36 Units into the skin daily at 10 pm.   Insulin Pen Needle (BD PEN NEEDLE NANO U/F) 32G X 4 MM MISC Use to inject insulin once daily   lisinopril (ZESTRIL) 2.5 MG tablet Take 2.5 mg by mouth daily.   metFORMIN (GLUCOPHAGE) 1000 MG tablet SMARTSIG:1  Tablet(s) By Mouth Morning-Evening   OZEMPIC, 1 MG/DOSE, 4 MG/3ML SOPN Inject 1 mg into the skin once a week.   pravastatin (PRAVACHOL) 40 MG tablet Take 1 tablet by mouth daily.   [DISCONTINUED] glipiZIDE (GLUCOTROL) 10 MG tablet Take 1 tablet (10 mg total) by mouth daily before breakfast.   glipiZIDE (GLUCOTROL) 10 MG tablet Take 0.5 tablets (5 mg total) by mouth daily before breakfast.   insulin aspart (NOVOLOG) 100 UNIT/ML injection Inject 12 Units into the skin 3 (three) times daily with meals. Before each meal 3 times a day, Ok to switch to PEN if approved. (Patient not taking: Reported on 01/29/2022)   [DISCONTINUED] glucose monitoring kit (FREESTYLE) monitoring kit 1 each by Does not apply route 4 (four) times daily - after meals and at bedtime. 1 month Diabetic Testing Supplies for QAC-QHS accuchecks. (Patient not taking: Reported on 01/29/2022)   [DISCONTINUED] metFORMIN (GLUCOPHAGE) 500 MG tablet Take 1 tablet (500 mg total) by mouth 2 (two) times daily with a meal. (Patient not taking:  Reported on 01/29/2022)   [DISCONTINUED] pravastatin (PRAVACHOL) 40 MG tablet Take 40 mg by mouth daily. (Patient not taking: Reported on 01/29/2022)   No facility-administered encounter medications on file as of 01/29/2022.    ALLERGIES: Allergies  Allergen Reactions   Morphine Itching    Side effect Side effect     VACCINATION STATUS:  There is no immunization history on file for this patient.  Diabetes He presents for his initial diabetic visit. He has type 2 diabetes mellitus. Onset time: diagnosed at approx age of 31. His disease course has been fluctuating (had been off all meds for some time). Hypoglycemia symptoms include nervousness/anxiousness, sweats and tremors. Associated symptoms include blurred vision, fatigue, polydipsia and polyuria. There are no hypoglycemic complications. Symptoms are improving. Diabetic complications include peripheral neuropathy. Risk factors for coronary artery  disease include diabetes mellitus, dyslipidemia, family history, male sex, hypertension and sedentary lifestyle. Current diabetic treatment includes insulin injections and oral agent (dual therapy) (and Ozempic). He is compliant with treatment most of the time (had been out of meds for quite some time). His weight is fluctuating minimally. He is following a generally unhealthy diet. When asked about meal planning, he reported none. He has not had a previous visit with a dietitian. He participates in exercise intermittently. His home blood glucose trend is decreasing steadily. (He presents today for his consultation with no meter or logs to review.  His most recent A1c was 14.8% on 01/05/22.  He says he had been out of his medications for quite some time leading up to this due to insurance problems.  He is now back on his meds and says the highest reading he has had recently was in the 140s.  He routinely checks glucose twice daily.  He drinks mostly water, some diet soda and some 1/2 sweetened tea.  He typically eats 3 meals per day and snacks some in between.  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  He is UTD on eye exam, never seen podiatry in the past.) An ACE inhibitor/angiotensin II receptor blocker is being taken. He does not see a podiatrist.Eye exam is current.  Hypertension This is a chronic problem. The current episode started more than 1 year ago. The problem has been resolved since onset. The problem is controlled. Associated symptoms include blurred vision and sweats. There are no associated agents to hypertension. Risk factors for coronary artery disease include diabetes mellitus, dyslipidemia, family history, male gender and sedentary lifestyle. Past treatments include ACE inhibitors. The current treatment provides moderate improvement. Compliance problems include diet and exercise.   Hyperlipidemia This is a chronic problem. The current episode started more than 1 year ago. The  problem is uncontrolled. Recent lipid tests were reviewed and are high. Exacerbating diseases include diabetes. Factors aggravating his hyperlipidemia include fatty foods. Current antihyperlipidemic treatment includes statins. The current treatment provides moderate improvement of lipids. Compliance problems include adherence to diet and adherence to exercise.  Risk factors for coronary artery disease include diabetes mellitus, dyslipidemia, family history, male sex, hypertension and a sedentary lifestyle.    Review of systems  Constitutional: + Minimally fluctuating body weight, current Body mass index is 28.74 kg/m., no fatigue, no subjective hyperthermia, no subjective hypothermia Eyes: no blurry vision (has cataracts), no xerophthalmia ENT: no sore throat, no nodules palpated in throat, no dysphagia/odynophagia, no hoarseness Cardiovascular: no chest pain, no shortness of breath, no palpitations, + leg swelling-especially after working all day Respiratory: no cough, no shortness of  breath Gastrointestinal: no nausea/vomiting/diarrhea Musculoskeletal: no muscle/joint aches Skin: no rashes, no hyperemia Neurological: no tremors, no numbness, no tingling, no dizziness Psychiatric: no depression, no anxiety  Objective:     BP 116/74    Pulse 65    Ht 5' 11.5" (1.816 m)    Wt 209 lb (94.8 kg)    SpO2 99%    BMI 28.74 kg/m   Wt Readings from Last 3 Encounters:  01/29/22 209 lb (94.8 kg)  09/01/21 200 lb (90.7 kg)  10/10/14 (!) 304 lb (137.9 kg)     BP Readings from Last 3 Encounters:  01/29/22 116/74  09/01/21 121/81  06/30/20 114/78     Physical Exam- Limited  Constitutional:  Body mass index is 28.74 kg/m. , not in acute distress, normal state of mind Eyes:  EOMI, no exophthalmos Neck: Supple Cardiovascular: RRR, no murmurs, rubs, or gallops, no edema Respiratory: Adequate breathing efforts, no crackles, rales, rhonchi, or wheezing Musculoskeletal: no gross deformities,  strength intact in all four extremities, no gross restriction of joint movements Skin:  no rashes, no hyperemia Neurological: no tremor with outstretched hands    CMP ( most recent) CMP     Component Value Date/Time   NA 128 (L) 10/04/2014 1405   K 4.5 10/04/2014 1405   CL 88 (L) 10/04/2014 1405   CO2 21 10/04/2014 1405   GLUCOSE 563 (HH) 10/04/2014 1405   BUN 15 01/03/2022 0000   CREATININE 0.9 01/03/2022 0000   CREATININE 0.75 10/04/2014 1405   CALCIUM 10.1 10/04/2014 1405   PROT 6.2 10/05/2014 1516   ALBUMIN 3.8 10/05/2014 1516   AST 18 10/05/2014 1516   ALT 22 10/05/2014 1516   ALKPHOS 115 10/05/2014 1516   BILITOT 0.4 10/05/2014 1516   GFRNONAA >90 10/04/2014 1405   GFRAA >90 10/04/2014 1405     Diabetic Labs (most recent): Lab Results  Component Value Date   HGBA1C 14.8 01/05/2022   HGBA1C 13.3 10/05/2014     Lipid Panel ( most recent) Lipid Panel     Component Value Date/Time   TRIG 373 (A) 01/03/2022 0000   LDLCALC 107 01/03/2022 0000      No results found for: TSH, FREET4         Assessment & Plan:   1) Type 2 diabetes mellitus with hyperglycemia, with long-term current use of insulin ([place])  He presents today for his consultation with no meter or logs to review.  His most recent A1c was 14.8% on 01/05/22.  He says he had been out of his medications for quite some time leading up to this due to insurance problems.  He is now back on his meds and says the highest reading he has had recently was in the 140s.  He routinely checks glucose twice daily.  He drinks mostly water, some diet soda and some 1/2 sweetened tea.  He typically eats 3 meals per day and snacks some in between.  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  He is UTD on eye exam, never seen podiatry in the past.  - Donley W Methot has currently uncontrolled symptomatic type 2 DM since 51 years of age, with most recent A1c of 14.8 %.   -Recent labs reviewed.  - I had a long  discussion with him about the progressive nature of diabetes and the pathology behind its complications. -his diabetes is not currently complicated but he remains at a high risk for more acute and chronic complications which  include CAD, CVA, CKD, retinopathy, and neuropathy. These are all discussed in detail with him.  The following Lifestyle Medicine recommendations according to [facility]) were discussed and offered to patient and he agrees to start the journey:  A. Whole Foods, Plant-based plate comprising of fruits and vegetables, plant-based proteins, whole-grain carbohydrates was discussed in detail with the patient.   A list for source of those nutrients were also provided to the patient.  Patient will use only water or unsweetened tea for hydration. B.  The need to stay away from risky substances including alcohol, smoking; obtaining 7 to 9 hours of restorative sleep, at least 150 minutes of moderate intensity exercise weekly, the importance of healthy social connections,  and stress reduction techniques were discussed. C.  A full color page of  Calorie density of various food groups per pound showing examples of each food groups was provided to the patient.  - I have counseled him on diet and weight management by adopting a carbohydrate restricted/protein rich diet. Patient is encouraged to switch to unprocessed or minimally processed complex starch and increased protein intake (animal or plant source), fruits, and vegetables. -  he is advised to stick to a routine mealtimes to eat 3 meals a day and avoid unnecessary snacks (to snack only to correct hypoglycemia).   - he acknowledges that there is a room for improvement in his food and drink choices. - Suggestion is made for him to avoid simple carbohydrates from his diet including Cakes, Sweet Desserts, Ice Cream, Soda (diet and regular), Sweet Tea, Candies, Chips, Cookies, Store Bought Juices, Alcohol in  Excess of 1-2 drinks a day, Artificial Sweeteners, Coffee Creamer, and "Sugar-free" Products. This will help patient to have more stable blood glucose profile and potentially avoid unintended weight gain.  - he will be scheduled with Jearld Fenton, RDN, CDE for diabetes education.  - I have approached him with the following individualized plan to manage his diabetes and patient agrees:   -He is advised to continue his Lantus 36 units SQ nightly.    -he is encouraged to start monitoring glucose 4 times daily, before meals and before bed, to log their readings on the clinic sheets provided, and bring them to review at follow up appointment in 2 weeks.  - he is warned not to take insulin without proper monitoring per orders. - Adjustment parameters are given to him for hypo and hyperglycemia in writing. - he is encouraged to call clinic for blood glucose levels less than 70 or above 300 mg /dl. - he is advised to continue Metformin 1000 mg po twice daily with meals and lower his dose of  Glipizide to 5 mg po daily with breakfast, therapeutically suitable for patient (given his account of low readings in between meals).  - he can also continue his Ozempic 1 mg SQ weekly, although increasing may not be safest option at this time due to elevated triglycerides increasing his risk of pancreatitis.  - Specific targets for  A1c; LDL, HDL, and Triglycerides were discussed with the patient.  2) Blood Pressure /Hypertension:  his blood pressure is controlled to target.   he is advised to continue his current medications including Lisinopril 2.5 mg p.o. daily with breakfast.  3) Lipids/Hyperlipidemia:    Review of his recent lipid panel from 01/03/22 showed uncontrolled LDL at 107 and elevated triglycerides of 373 .  he is advised to continue Pravastatin 40 mg daily at bedtime.  Side effects and  precautions discussed with him.  4)  Weight/Diet:  his Body mass index is 28.74 kg/m.  -    he is NOT a  candidate for major weight loss. I discussed with him the fact that loss of 5 - 10% of his  current body weight will have the most impact on his diabetes management.  Exercise, and detailed carbohydrates information provided  -  detailed on discharge instructions.  5) Chronic Care/Health Maintenance: -he is on ACEI/ARB and Statin medications and is encouraged to initiate and continue to follow up with Ophthalmology, Dentist, Podiatrist at least yearly or according to recommendations, and advised to stay away from smoking. I have recommended yearly flu vaccine and pneumonia vaccine at least every 5 years; moderate intensity exercise for up to 150 minutes weekly; and sleep for at least 7 hours a day.  - he is advised to maintain close follow up with Kotturi, Tyler Deis, MD for primary care needs, as well as his other providers for optimal and coordinated care.   - Time spent in this patient care: 60 min, of which > 50% was spent in counseling him about his diabetes and the rest reviewing his blood glucose logs, discussing his hypoglycemia and hyperglycemia episodes, reviewing his current and previous labs/studies (including abstraction from other facilities) and medications doses and developing a long term treatment plan based on the latest standards of care/guidelines; and documenting his care.    Please refer to Patient Instructions for Blood Glucose Monitoring and Insulin/Medications Dosing Guide" in media tab for additional information. Please also refer to "Patient Self Inventory" in the Media tab for reviewed elements of pertinent patient history.  Benjamine Mola participated in the discussions, expressed understanding, and voiced agreement with the above plans.  All questions were answered to his satisfaction. he is encouraged to contact clinic should he have any questions or concerns prior to his return visit.     Follow up plan: - Return in about 2 weeks (around 02/12/2022) for Diabetes F/U,  Bring meter and logs, No previsit labs.    Rayetta Pigg, [facility] [address] [place], [place] [address] Phone: [phone] Fax: [phone]  01/29/2022, 11:41 AM

## 2022-02-12 ENCOUNTER — Ambulatory Visit: Payer: BC Managed Care – PPO | Admitting: Nurse Practitioner

## 2022-02-12 NOTE — Patient Instructions (Incomplete)

## 2022-02-13 ENCOUNTER — Other Ambulatory Visit: Payer: Self-pay

## 2022-02-13 ENCOUNTER — Encounter: Payer: Self-pay | Admitting: Nurse Practitioner

## 2022-02-13 ENCOUNTER — Ambulatory Visit (INDEPENDENT_AMBULATORY_CARE_PROVIDER_SITE_OTHER): Payer: BC Managed Care – PPO | Admitting: Nurse Practitioner

## 2022-02-13 VITALS — BP 112/74 | HR 76 | Ht 71.5 in | Wt 209.0 lb

## 2022-02-13 DIAGNOSIS — Z794 Long term (current) use of insulin: Secondary | ICD-10-CM

## 2022-02-13 DIAGNOSIS — E782 Mixed hyperlipidemia: Secondary | ICD-10-CM

## 2022-02-13 DIAGNOSIS — I10 Essential (primary) hypertension: Secondary | ICD-10-CM

## 2022-02-13 DIAGNOSIS — E1165 Type 2 diabetes mellitus with hyperglycemia: Secondary | ICD-10-CM | POA: Diagnosis not present

## 2022-02-13 MED ORDER — FREESTYLE LIBRE 2 SENSOR MISC: 6 refills | Status: DC

## 2022-02-13 MED ORDER — OZEMPIC (1 MG/DOSE) 4 MG/3ML ~~LOC~~ SOPN: 1.0000 mg | PEN_INJECTOR | SUBCUTANEOUS | 3 refills | Status: DC

## 2022-02-13 NOTE — Progress Notes (Signed)
? ?                                                    Endocrinology Follow Up Note  ?     02/13/2022, 9:06 AM ? ? ?Subjective:  ? ? Patient ID: Bradley Miller, male    DOB: 1971-05-24.  ?Bradley Miller is being seen in follow up after being seen in consultation for management of currently uncontrolled symptomatic diabetes requested by  Wilburt Finlay, MD. ? ? ?Past Medical History:  ?Diagnosis Date  ? Diabetes mellitus without complication ([place])   ? ? ?Past Surgical History:  ?Procedure Laterality Date  ? ANKLE FRACTURE SURGERY    ? ? ?Social History  ? ?Socioeconomic History  ? Marital status: Divorced  ?  Spouse name: Not on file  ? Number of children: Not on file  ? Years of education: Not on file  ? Highest education level: Not on file  ?Occupational History  ? Not on file  ?Tobacco Use  ? Smoking status: Former  ?  Types: Cigarettes  ?  Passive exposure: Past  ? Smokeless tobacco: Former  ?Vaping Use  ? Vaping Use: Former  ?Substance and Sexual Activity  ? Alcohol use: Yes  ?  Comment: occ  ? Drug use: No  ? Sexual activity: Not on file  ?Other Topics Concern  ? Not on file  ?Social History Narrative  ? Not on file  ? ?Social Determinants of Health  ? ?Financial Resource Strain: Not on file  ?Food Insecurity: Not on file  ?Transportation Needs: Not on file  ?Physical Activity: Not on file  ?Stress: Not on file  ?Social Connections: Not on file  ? ? ?History reviewed. No pertinent family history. ? ?Outpatient Encounter Medications as of 02/13/2022  ?Medication Sig  ? Continuous Blood Gluc Sensor (FREESTYLE LIBRE 2 SENSOR) MISC Change sensor every 14 days.  ? aspirin 81 MG EC tablet Take 1 tablet by mouth daily.  ? gabapentin (NEURONTIN) 100 MG capsule Take 100 mg by mouth 3 (three) times daily.  ? glipiZIDE (GLUCOTROL) 10 MG tablet Take 0.5 tablets (5 mg total) by mouth daily before breakfast.  ? Insulin Glargine (LANTUS SOLOSTAR) 100 UNIT/ML Solostar Pen Inject 36 Units into the skin daily  at 10 pm.  ? Insulin Pen Needle (BD PEN NEEDLE NANO U/F) 32G X 4 MM MISC Use to inject insulin once daily  ? lisinopril (ZESTRIL) 2.5 MG tablet Take 2.5 mg by mouth daily.  ? metFORMIN (GLUCOPHAGE) 1000 MG tablet 2 (two) times daily with a meal.  ? OZEMPIC, 1 MG/DOSE, 4 MG/3ML SOPN Inject 1 mg into the skin once a week.  ? pravastatin (PRAVACHOL) 40 MG tablet Take 1 tablet by mouth daily.  ? [DISCONTINUED] insulin aspart (NOVOLOG FLEXPEN) 100 UNIT/ML FlexPen Inject into the skin 3 (three) times daily with meals. (Patient not taking: Reported on 02/13/2022)  ? [DISCONTINUED] insulin aspart (NOVOLOG) 100 UNIT/ML injection Inject 12 Units into the skin 3 (three) times daily with meals. Before each meal 3 times a day, Ok to switch to PEN if approved. (Patient not taking: Reported on 01/29/2022)  ? [DISCONTINUED] OZEMPIC, 1 MG/DOSE, 4 MG/3ML SOPN Inject 1 mg into the skin once a week. (Patient not taking: Reported on 02/13/2022)  ? ?No facility-administered encounter medications on file as  of 02/13/2022.  ? ? ?ALLERGIES: ?Allergies  ?Allergen Reactions  ? Morphine Itching  ?  Side effect ?Side effect ?  ? ? ?VACCINATION STATUS: ? ?There is no immunization history on file for this patient. ? ?Diabetes ?He presents for his follow-up diabetic visit. He has type 2 diabetes mellitus. Onset time: diagnosed at approx age of 52. His disease course has been fluctuating (had been off all meds for some time). Hypoglycemia symptoms include nervousness/anxiousness, sweats and tremors. Associated symptoms include blurred vision, fatigue, polydipsia and polyuria. There are no hypoglycemic complications. Symptoms are stable. Diabetic complications include peripheral neuropathy. Risk factors for coronary artery disease include diabetes mellitus, dyslipidemia, family history, male sex, hypertension and sedentary lifestyle. Current diabetic treatment includes insulin injections and oral agent (dual therapy) (and Ozempic). He is compliant with  treatment most of the time (had been out of meds for quite some time). His weight is fluctuating minimally. He is following a generally unhealthy diet. When asked about meal planning, he reported none. He has not had a previous visit with a dietitian. He participates in exercise intermittently. His home blood glucose trend is fluctuating minimally. His overall blood glucose range is 180-200 mg/dl. (He presents today with his meter, no logs, showing inconsistent glucose monitoring pattern with gross hyperglycemia overall.  He was not due for another A1c today.  Analysis of his meter shows 7-day average of 186 with 5 readings; 14-day average of 187 with 10 readings; 30-day average of 159 with 23 readings; and 90-day average of 205 with 36 readings.  He did have one low reading of 66 which scared him.  He also notes he ran out of his Ozempic between visits.) An ACE inhibitor/angiotensin II receptor blocker is being taken. He does not see a podiatrist.Eye exam is current.  ?Hypertension ?This is a chronic problem. The current episode started more than 1 year ago. The problem has been resolved since onset. The problem is controlled. Associated symptoms include blurred vision and sweats. There are no associated agents to hypertension. Risk factors for coronary artery disease include diabetes mellitus, dyslipidemia, family history, male gender and sedentary lifestyle. Past treatments include ACE inhibitors. The current treatment provides moderate improvement. Compliance problems include diet and exercise.   ?Hyperlipidemia ?This is a chronic problem. The current episode started more than 1 year ago. The problem is uncontrolled. Recent lipid tests were reviewed and are high. Exacerbating diseases include diabetes. Factors aggravating his hyperlipidemia include fatty foods. Current antihyperlipidemic treatment includes statins. The current treatment provides moderate improvement of lipids. Compliance problems include  adherence to diet and adherence to exercise.  Risk factors for coronary artery disease include diabetes mellitus, dyslipidemia, family history, male sex, hypertension and a sedentary lifestyle.  ? ? ?Review of systems ? ?Constitutional: + Minimally fluctuating body weight, current Body mass index is 28.74 kg/m?., no fatigue, no subjective hyperthermia, no subjective hypothermia ?Eyes: no blurry vision (has cataracts), no xerophthalmia ?ENT: no sore throat, no nodules palpated in throat, no dysphagia/odynophagia, no hoarseness ?Cardiovascular: no chest pain, no shortness of breath, no palpitations, + leg swelling-especially after working all day ?Respiratory: no cough, no shortness of breath ?Gastrointestinal: no nausea/vomiting/diarrhea ?Musculoskeletal: no muscle/joint aches ?Skin: no rashes, no hyperemia ?Neurological: no tremors, no numbness, no tingling, no dizziness ?Psychiatric: no depression, no anxiety ? ?Objective:  ?  ? ?BP 112/74   Pulse 76   Ht 5' 11.5" (1.816 m)   Wt 209 lb (94.8 kg)   BMI 28.74 kg/m?   ?Wt  Readings from Last 3 Encounters:  ?02/13/22 209 lb (94.8 kg)  ?01/29/22 209 lb (94.8 kg)  ?09/01/21 200 lb (90.7 kg)  ?  ? ?BP Readings from Last 3 Encounters:  ?02/13/22 112/74  ?01/29/22 116/74  ?09/01/21 121/81  ?  ? ? ?Physical Exam- Limited ? ?Constitutional:  Body mass index is 28.74 kg/m?. , not in acute distress, normal state of mind ?Eyes:  EOMI, no exophthalmos ?Neck: Supple ?Cardiovascular: RRR, no murmurs, rubs, or gallops, no edema ?Respiratory: Adequate breathing efforts, no crackles, rales, rhonchi, or wheezing ?Musculoskeletal: no gross deformities, strength intact in all four extremities, no gross restriction of joint movements ?Skin:  no rashes, no hyperemia ?Neurological: no tremor with outstretched hands ? ? ? ?CMP ( most recent) ?CMP  ?   ?Component Value Date/Time  ? NA 128 (L) 10/04/2014 1405  ? K 4.5 10/04/2014 1405  ? CL 88 (L) 10/04/2014 1405  ? CO2 21 10/04/2014 1405  ?  GLUCOSE 563 (HH) 10/04/2014 1405  ? BUN 15 01/03/2022 0000  ? CREATININE 0.9 01/03/2022 0000  ? CREATININE 0.75 10/04/2014 1405  ? CALCIUM 10.1 10/04/2014 1405  ? PROT 6.2 10/05/2014 1516  ? ALBUMIN 3.8 11/11/201

## 2022-02-13 NOTE — Patient Instructions (Signed)
Diabetes Mellitus Emergency Preparedness Plan ?A diabetes emergency preparedness plan is a checklist to make sure you have everything you need to manage your diabetes in case of an emergency, such as an evacuation, natural disaster, national security emergency, or pandemic lockdown. ?Managing your diabetes is something you have to do all day every day. The American Diabetes Association and the American College of Endocrinology both recommend putting together an emergency diabetes kit. Your kit should include important information and documents as well as all the supplies you will need to manage your diabetes for at least 1 week. Store it in a portable, waterproof bag or container. The best time to start making your emergency kit is now. ?How to make your emergency kit ?Collect information and documents ?Include the following information and documents in your kit: ?The type of diabetes you have. ?A copy of your health insurance cards and photo ID. ?A list of all your other medical conditions, allergies, and surgeries. ?A list of all your medicines and doses with the contact information for your pharmacy. Ask your health care provider for a list of your current medicines. ?Any recent lab results, including your latest hemoglobin A1C (HbA1C). ?The make, model, and serial number of your insulin pump, if you use one. Also include contact information for the manufacturer. ?Contact information for people who should be notified in case of an emergency. Include your health care provider's name, address, and phone number. ?Collect diabetes care items ?Include the following diabetes care items in your kit: ?At least a 1-week supply of: ?Oral medicines. ?Insulin. ?Blood glucose testing supplies. These include testing strips, lancets, and extra batteries for your blood glucose monitor and pump. ?A charger for the continuous glucose monitor (CGM) receiver and pump. ?Any extra supplies needed for your CGM or pump. ?A supply of  glucagon, glucose tablets, juice, soda, or hard candy in case of hypoglycemia. ?Coolers or cold packs. ?A safe container for syringes, needles, and lancets. ? ?Other preparations ?Other things to consider doing as part of your emergency plan: ?Make sure that your mobile phone is charged and that you have an extra charger, cable, or batteries. ?Choose a meeting place for family members. ?Wear a medical alert or ID bracelet. ?If you have a child with diabetes, make sure your child's school has a copy of his or her emergency plan, including the name of the staff member who will assist your child. ?Where to find more information ?American Diabetes Association: www.diabetes.org ?Centers for Disease Control and Prevention: blogs.cdc.gov ?Summary ?A diabetes emergency preparedness plan is a checklist to make sure you have everything you need in case of an emergency. ?Your kit should include important information and documents as well as all the supplies you will need to manage your condition for at least 1 week. ?Store your kit in a portable, waterproof bag or container. ?The best time to start making your emergency kit is now. ?This information is not intended to replace advice given to you by your health care provider. Make sure you discuss any questions you have with your health care provider. ?Document Revised: 05/18/2020 Document Reviewed: 05/18/2020 ?Elsevier Patient Education ? 2022 Elsevier Inc. ? ?

## 2022-03-11 ENCOUNTER — Telehealth: Payer: Self-pay | Admitting: Nurse Practitioner

## 2022-03-11 DIAGNOSIS — Z794 Long term (current) use of insulin: Secondary | ICD-10-CM

## 2022-03-11 NOTE — Telephone Encounter (Signed)
Patient requesting refill on his Lantus. [facility] on [address] ?

## 2022-03-12 MED ORDER — LANTUS SOLOSTAR 100 UNIT/ML ~~LOC~~ SOPN: 36.0000 [IU] | PEN_INJECTOR | Freq: Every day | SUBCUTANEOUS | 1 refills | Status: DC

## 2022-03-12 NOTE — Telephone Encounter (Signed)
Rx sent 

## 2022-03-19 ENCOUNTER — Ambulatory Visit: Payer: BC Managed Care – PPO | Admitting: Nurse Practitioner

## 2022-03-19 NOTE — Patient Instructions (Incomplete)
Diabetes Mellitus and Foot Care Foot care is an important part of your health, especially when you have diabetes. Diabetes may cause you to have problems because of poor blood flow (circulation) to your feet and legs, which can cause your skin to: Become thinner and drier. Break more easily. Heal more slowly. Peel and crack. You may also have nerve damage (neuropathy) in your legs and feet, causing decreased feeling in them. This means that you may not notice minor injuries to your feet that could lead to more serious problems. Noticing and addressing any potential problems early is the best way to prevent future foot problems. How to care for your feet Foot hygiene  Wash your feet daily with warm water and mild soap. Do not use hot water. Then, pat your feet and the areas between your toes until they are completely dry. Do not soak your feet as this can dry your skin. Trim your toenails straight across. Do not dig under them or around the cuticle. File the edges of your nails with an emery board or nail file. Apply a moisturizing lotion or petroleum jelly to the skin on your feet and to dry, brittle toenails. Use lotion that does not contain alcohol and is unscented. Do not apply lotion between your toes. Shoes and socks Wear clean socks or stockings every day. Make sure they are not too tight. Do not wear knee-high stockings since they may decrease blood flow to your legs. Wear shoes that fit properly and have enough cushioning. Always look in your shoes before you put them on to be sure there are no objects inside. To break in new shoes, wear them for just a few hours a day. This prevents injuries on your feet. Wounds, scrapes, corns, and calluses  Check your feet daily for blisters, cuts, bruises, sores, and redness. If you cannot see the bottom of your feet, use a mirror or ask someone for help. Do not cut corns or calluses or try to remove them with medicine. If you find a minor scrape,  cut, or break in the skin on your feet, keep it and the skin around it clean and dry. You may clean these areas with mild soap and water. Do not clean the area with peroxide, alcohol, or iodine. If you have a wound, scrape, corn, or callus on your foot, look at it several times a day to make sure it is healing and not infected. Check for: Redness, swelling, or pain. Fluid or blood. Warmth. Pus or a bad smell. General tips Do not cross your legs. This may decrease blood flow to your feet. Do not use heating pads or hot water bottles on your feet. They may burn your skin. If you have lost feeling in your feet or legs, you may not know this is happening until it is too late. Protect your feet from hot and cold by wearing shoes, such as at the beach or on hot pavement. Schedule a complete foot exam at least once a year (annually) or more often if you have foot problems. Report any cuts, sores, or bruises to your health care provider immediately. Where to find more information American Diabetes Association: www.diabetes.org Association of Diabetes Care & Education Specialists: www.diabeteseducator.org Contact a health care provider if: You have a medical condition that increases your risk of infection and you have any cuts, sores, or bruises on your feet. You have an injury that is not healing. You have redness on your legs or feet. You   feel burning or tingling in your legs or feet. You have pain or cramps in your legs and feet. Your legs or feet are numb. Your feet always feel cold. You have pain around any toenails. Get help right away if: You have a wound, scrape, corn, or callus on your foot and: You have pain, swelling, or redness that gets worse. You have fluid or blood coming from the wound, scrape, corn, or callus. Your wound, scrape, corn, or callus feels warm to the touch. You have pus or a bad smell coming from the wound, scrape, corn, or callus. You have a fever. You have a red  line going up your leg. Summary Check your feet every day for blisters, cuts, bruises, sores, and redness. Apply a moisturizing lotion or petroleum jelly to the skin on your feet and to dry, brittle toenails. Wear shoes that fit properly and have enough cushioning. If you have foot problems, report any cuts, sores, or bruises to your health care provider immediately. Schedule a complete foot exam at least once a year (annually) or more often if you have foot problems. This information is not intended to replace advice given to you by your health care provider. Make sure you discuss any questions you have with your health care provider. Document Revised: 06/01/2020 Document Reviewed: 06/01/2020 Elsevier Patient Education  2023 Elsevier Inc.  

## 2023-02-19 ENCOUNTER — Inpatient Hospital Stay (HOSPITAL_COMMUNITY)
Admission: EM | Admit: 2023-02-19 | Discharge: 2023-02-23 | DRG: 853 | Disposition: A | Discharge status: Home / Self Care | Payer: Self-pay | Attending: Family Medicine | Admitting: Family Medicine

## 2023-02-19 ENCOUNTER — Emergency Department (HOSPITAL_COMMUNITY): Payer: Self-pay

## 2023-02-19 ENCOUNTER — Encounter (HOSPITAL_COMMUNITY): Payer: Self-pay | Admitting: Emergency Medicine

## 2023-02-19 ENCOUNTER — Other Ambulatory Visit: Payer: Self-pay

## 2023-02-19 DIAGNOSIS — E785 Hyperlipidemia, unspecified: Secondary | ICD-10-CM | POA: Diagnosis present

## 2023-02-19 DIAGNOSIS — Z28310 Unvaccinated for covid-19: Secondary | ICD-10-CM

## 2023-02-19 DIAGNOSIS — F17210 Nicotine dependence, cigarettes, uncomplicated: Secondary | ICD-10-CM | POA: Diagnosis present

## 2023-02-19 DIAGNOSIS — A630 Anogenital (venereal) warts: Secondary | ICD-10-CM | POA: Diagnosis present

## 2023-02-19 DIAGNOSIS — R9431 Abnormal electrocardiogram [ECG] [EKG]: Secondary | ICD-10-CM | POA: Diagnosis present

## 2023-02-19 DIAGNOSIS — N492 Inflammatory disorders of scrotum: Secondary | ICD-10-CM | POA: Diagnosis present

## 2023-02-19 DIAGNOSIS — R079 Chest pain, unspecified: Secondary | ICD-10-CM

## 2023-02-19 DIAGNOSIS — Z7985 Long-term (current) use of injectable non-insulin antidiabetic drugs: Secondary | ICD-10-CM

## 2023-02-19 DIAGNOSIS — Z794 Long term (current) use of insulin: Secondary | ICD-10-CM

## 2023-02-19 DIAGNOSIS — E860 Dehydration: Secondary | ICD-10-CM | POA: Diagnosis present

## 2023-02-19 DIAGNOSIS — Z885 Allergy status to narcotic agent status: Secondary | ICD-10-CM

## 2023-02-19 DIAGNOSIS — Z79899 Other long term (current) drug therapy: Secondary | ICD-10-CM

## 2023-02-19 DIAGNOSIS — A419 Sepsis, unspecified organism: Principal | ICD-10-CM | POA: Diagnosis present

## 2023-02-19 DIAGNOSIS — E1110 Type 2 diabetes mellitus with ketoacidosis without coma: Secondary | ICD-10-CM | POA: Diagnosis present

## 2023-02-19 DIAGNOSIS — Z681 Body mass index (BMI) 19 or less, adult: Secondary | ICD-10-CM

## 2023-02-19 DIAGNOSIS — E861 Hypovolemia: Secondary | ICD-10-CM | POA: Diagnosis present

## 2023-02-19 DIAGNOSIS — Z7984 Long term (current) use of oral hypoglycemic drugs: Secondary | ICD-10-CM

## 2023-02-19 DIAGNOSIS — R112 Nausea with vomiting, unspecified: Secondary | ICD-10-CM

## 2023-02-19 DIAGNOSIS — N179 Acute kidney failure, unspecified: Secondary | ICD-10-CM | POA: Diagnosis present

## 2023-02-19 DIAGNOSIS — E43 Unspecified severe protein-calorie malnutrition: Secondary | ICD-10-CM | POA: Diagnosis present

## 2023-02-19 LAB — CBC
HCT: 56.6 % — ABNORMAL HIGH (ref 39.0–52.0)
Hemoglobin: 19.3 g/dL — ABNORMAL HIGH (ref 13.0–17.0)
MCH: 30.8 pg (ref 26.0–34.0)
MCHC: 34.1 g/dL (ref 30.0–36.0)
MCV: 90.3 fL (ref 80.0–100.0)
Platelets: 530 10*3/uL — ABNORMAL HIGH (ref 150–400)
RBC: 6.27 MIL/uL — ABNORMAL HIGH (ref 4.22–5.81)
RDW: 13 % (ref 11.5–15.5)
WBC: 32.8 10*3/uL — ABNORMAL HIGH (ref 4.0–10.5)
nRBC: 0 % (ref 0.0–0.2)

## 2023-02-19 LAB — BLOOD GAS, VENOUS
Acid-base deficit: 24.5 mmol/L — ABNORMAL HIGH (ref 0.0–2.0)
Bicarbonate: 5.8 mmol/L — ABNORMAL LOW (ref 20.0–28.0)
Drawn by: 43427
O2 Saturation: 69.1 %
Patient temperature: 36.7
pCO2, Ven: 24 mmHg — ABNORMAL LOW (ref 44–60)
pH, Ven: 6.99 — CL (ref 7.25–7.43)
pO2, Ven: 39 mmHg (ref 32–45)

## 2023-02-19 MED ORDER — INSULIN ASPART 100 UNIT/ML IV SOLN
10.0000 [IU] | Freq: Once | INTRAVENOUS | Status: AC
  Administered 2023-02-20: 10 [IU] via INTRAVENOUS

## 2023-02-19 MED ORDER — HYDROMORPHONE HCL 1 MG/ML IJ SOLN
1.0000 mg | Freq: Once | INTRAMUSCULAR | Status: AC
  Administered 2023-02-19: 1 mg via INTRAVENOUS
  Filled 2023-02-19: qty 1

## 2023-02-19 MED ORDER — LACTATED RINGERS IV BOLUS
1000.0000 mL | Freq: Once | INTRAVENOUS | Status: AC
  Administered 2023-02-19: 1000 mL via INTRAVENOUS

## 2023-02-19 MED ORDER — ONDANSETRON HCL 4 MG/2ML IJ SOLN
4.0000 mg | Freq: Once | INTRAMUSCULAR | Status: AC
  Administered 2023-02-19: 4 mg via INTRAVENOUS
  Filled 2023-02-19: qty 2

## 2023-02-19 MED ORDER — SODIUM BICARBONATE 8.4 % IV SOLN
50.0000 meq | Freq: Once | INTRAVENOUS | Status: AC
  Administered 2023-02-20: 50 meq via INTRAVENOUS
  Filled 2023-02-19: qty 50

## 2023-02-19 NOTE — ED Triage Notes (Signed)
Pt bib EMS after he called c/o nausea and vomiting "all day". Pt also c/o CP across whole chest from cough x 8-9hrs. EMS also reports pt c/o SOB and stated that pt was 100% on RA but that they placed pt on 2L [place] at pts request. Blood Glucose per EMS was 314.

## 2023-02-19 NOTE — ED Provider Notes (Signed)
[facility] Emergency Department Provider Note MRN:  [id number]  Arrival date & time: 02/20/23     Chief Complaint   Emesis   History of Present Illness   Bradley Miller is a 52 y.o. year-old male with a history of diabetes presenting to the ED with chief complaint of emesis.  Severe chest discomfort with nausea and vomiting starting about 8 hours ago.  Becoming worse and worse.  No abdominal pain.  No recent fever.  Short of breath as well.  Review of Systems  A thorough review of systems was obtained and all systems are negative except as noted in the HPI and PMH.   Patient's Health History    Past Medical History:  Diagnosis Date   Diabetes mellitus without complication ([place])     Past Surgical History:  Procedure Laterality Date   ANKLE FRACTURE SURGERY      History reviewed. No pertinent family history.  Social History   Socioeconomic History   Marital status: Divorced    Spouse name: Not on file   Number of children: Not on file   Years of education: Not on file   Highest education level: Not on file  Occupational History   Not on file  Tobacco Use   Smoking status: Former    Types: Cigarettes    Passive exposure: Past   Smokeless tobacco: Former  Scientific laboratory technician Use: Former  Substance and Sexual Activity   Alcohol use: Yes    Comment: occ   Drug use: No   Sexual activity: Not on file  Other Topics Concern   Not on file  Social History Narrative   Not on file   Social Determinants of Health   Financial Resource Strain: Not on file  Food Insecurity: Not on file  Transportation Needs: Not on file  Physical Activity: Not on file  Stress: Not on file  Social Connections: Not on file  Intimate Partner Violence: Not on file     Physical Exam   Vitals:   02/20/23 0050 02/20/23 0100  BP: 126/89 (!) 134/94  Pulse: (!) 120 (!) 120  Resp: (!) 21 (!) 22  Temp:    SpO2: 100% 100%    CONSTITUTIONAL: Chronically  ill-appearing, NAD NEURO/PSYCH:  Alert and oriented x 3, no focal deficits EYES:  eyes equal and reactive ENT/NECK:  no LAD, no JVD CARDIO: Tachycardic rate, well-perfused, normal S1 and S2 PULM:  CTAB no wheezing or rhonchi GI/GU:  non-distended, non-tender MSK/SPINE:  No gross deformities, no edema SKIN:  no rash, atraumatic   *Additional and/or pertinent findings included in MDM below  Diagnostic and Interventional Summary    EKG Interpretation  Date/Time:  Wednesday February 19 2023 23:36:30 EDT Ventricular Rate:  115 PR Interval:  136 QRS Duration: 112 QT Interval:  486 QTC Calculation: 673 R Axis:   69 Text Interpretation: Sinus tachycardia Right atrial enlargement Incomplete right bundle branch block Probable inferior infarct, old Prolonged QT interval Confirmed by Gerlene Fee [phone]) on 02/19/2023 11:49:16 PM       Labs Reviewed  LIPASE, BLOOD - Abnormal; Notable for the following components:      Result Value   Lipase 53 (*)    All other components within normal limits  COMPREHENSIVE METABOLIC PANEL - Abnormal; Notable for the following components:   Sodium 130 (*)    Chloride 90 (*)    CO2 9 (*)    Glucose, Bld 453 (*)    Creatinine,  Ser 1.41 (*)    Calcium 8.7 (*)    AST 14 (*)    Alkaline Phosphatase 154 (*)    Total Bilirubin 2.6 (*)    GFR, Estimated 60 (*)    Anion gap 31 (*)    All other components within normal limits  CBC - Abnormal; Notable for the following components:   WBC 32.8 (*)    RBC 6.27 (*)    Hemoglobin 19.3 (*)    HCT 56.6 (*)    Platelets 530 (*)    All other components within normal limits  LACTIC ACID, PLASMA - Abnormal; Notable for the following components:   Lactic Acid, Venous 2.9 (*)    All other components within normal limits  BLOOD GAS, VENOUS - Abnormal; Notable for the following components:   pH, Ven 6.99 (*)    pCO2, Ven 24 (*)    Bicarbonate 5.8 (*)    Acid-base deficit 24.5 (*)    All other components within  normal limits  CBG MONITORING, ED - Abnormal; Notable for the following components:   Glucose-Capillary 451 (*)    All other components within normal limits  CBG MONITORING, ED - Abnormal; Notable for the following components:   Glucose-Capillary 472 (*)    All other components within normal limits  URINALYSIS, ROUTINE W REFLEX MICROSCOPIC  BASIC METABOLIC PANEL  BASIC METABOLIC PANEL  BASIC METABOLIC PANEL  BASIC METABOLIC PANEL  BASIC METABOLIC PANEL  BETA-HYDROXYBUTYRIC ACID  BETA-HYDROXYBUTYRIC ACID  BETA-HYDROXYBUTYRIC ACID  TROPONIN I (HIGH SENSITIVITY)  TROPONIN I (HIGH SENSITIVITY)    CT Angio Chest/Abd/Pel for Dissection W and/or Wo Contrast  Final Result    DG Chest Port 1 View  Final Result      Medications  insulin regular, human (MYXREDLIN) 100 units/ 100 mL infusion (13 Units/hr Intravenous New Bag/Given 02/20/23 0058)  lactated ringers infusion (has no administration in time range)  dextrose 5 % in lactated ringers infusion (has no administration in time range)  dextrose 50 % solution 0-50 mL (has no administration in time range)  potassium chloride 10 mEq in 100 mL IVPB (10 mEq Intravenous New Bag/Given 02/20/23 0051)  HYDROmorphone (DILAUDID) injection 1 mg (1 mg Intravenous Given 02/19/23 2317)  ondansetron (ZOFRAN) injection 4 mg (4 mg Intravenous Given 02/19/23 2316)  lactated ringers bolus 1,000 mL (1,000 mLs Intravenous New Bag/Given 02/19/23 2359)  insulin aspart (novoLOG) injection 10 Units (10 Units Intravenous Given 02/20/23 0009)  sodium bicarbonate injection 50 mEq (50 mEq Intravenous Given 02/20/23 0000)  iohexol (OMNIPAQUE) 350 MG/ML injection 100 mL (100 mLs Intravenous Contrast Given 02/20/23 0023)  lactated ringers bolus 1,496 mL (1,496 mLs Intravenous New Bag/Given 02/20/23 0052)     Procedures  /  Critical Care .Critical Care  Performed by: Maudie Flakes, MD Authorized by: Maudie Flakes, MD   Critical care provider statement:    Critical  care time (minutes):  80   Critical care was necessary to treat or prevent imminent or life-threatening deterioration of the following conditions:  Metabolic crisis   Critical care was time spent personally by me on the following activities:  Development of treatment plan with patient or surrogate, discussions with consultants, evaluation of patient's response to treatment, examination of patient, ordering and review of laboratory studies, ordering and review of radiographic studies, ordering and performing treatments and interventions, pulse oximetry, re-evaluation of patient's condition and review of old charts   ED Course and Medical Decision Making  Initial Impression and Ddx Differential  diagnosis includes ACS, PE, dissection, DKA.  Patient appears acutely ill, tachycardic, blood pressure reassuring.  Appears well-perfused.  Tachypneic, but clear lung sounds.  Past medical/surgical history that increases complexity of ED encounter: Diabetes  Interpretation of Diagnostics I personally reviewed the EKG and my interpretation is as follows: Sinus tachycardia with prolonged QT interval, nonspecific T wave and ST segment findings  Labs notable for prominent leukocytosis, severe acidosis, base deficit.  Patient Reassessment and Ultimate Disposition/Management     Patient seems more comfortable, providing insulin drip for management of DKA.  CT scan is without other notable emergent pathology, no signs of dissection or PE.  Will admit to medicine.  With further history patient explains he has been out of his diabetes medicine for quite some time.  Patient management required discussion with the following services or consulting groups:  Hospitalist Service  Complexity of Problems Addressed Acute illness or injury that poses threat of life of bodily function  Additional Data Reviewed and Analyzed Further history obtained from: EMS on arrival  Additional Factors Impacting ED Encounter  Risk Use of parenteral controlled substances and Consideration of hospitalization  Barth Kirks. Sedonia Small, MD [place] [email or web address]  Final Clinical Impressions(s) / ED Diagnoses     ICD-10-CM   1. Nausea and vomiting, unspecified vomiting type  R11.2     2. Chest pain, unspecified type  R07.9     3. Diabetic ketoacidosis without coma associated with type 2 diabetes mellitus ([place])  E11.10       ED Discharge Orders     None        Discharge Instructions Discussed with and Provided to Patient:   Discharge Instructions   None      Maudie Flakes, MD 02/20/23 0111

## 2023-02-20 ENCOUNTER — Inpatient Hospital Stay (HOSPITAL_COMMUNITY): Payer: Self-pay

## 2023-02-20 DIAGNOSIS — N179 Acute kidney failure, unspecified: Secondary | ICD-10-CM

## 2023-02-20 DIAGNOSIS — E43 Unspecified severe protein-calorie malnutrition: Secondary | ICD-10-CM | POA: Insufficient documentation

## 2023-02-20 DIAGNOSIS — R9431 Abnormal electrocardiogram [ECG] [EKG]: Secondary | ICD-10-CM | POA: Diagnosis present

## 2023-02-20 DIAGNOSIS — A419 Sepsis, unspecified organism: Secondary | ICD-10-CM

## 2023-02-20 DIAGNOSIS — N492 Inflammatory disorders of scrotum: Secondary | ICD-10-CM

## 2023-02-20 DIAGNOSIS — E782 Mixed hyperlipidemia: Secondary | ICD-10-CM

## 2023-02-20 DIAGNOSIS — E1110 Type 2 diabetes mellitus with ketoacidosis without coma: Secondary | ICD-10-CM | POA: Diagnosis present

## 2023-02-20 DIAGNOSIS — E860 Dehydration: Secondary | ICD-10-CM

## 2023-02-20 DIAGNOSIS — L0390 Cellulitis, unspecified: Secondary | ICD-10-CM

## 2023-02-20 DIAGNOSIS — N5089 Other specified disorders of the male genital organs: Secondary | ICD-10-CM

## 2023-02-20 DIAGNOSIS — E785 Hyperlipidemia, unspecified: Secondary | ICD-10-CM | POA: Diagnosis present

## 2023-02-20 LAB — GLUCOSE, CAPILLARY
Glucose-Capillary: 298 mg/dL — ABNORMAL HIGH (ref 70–99)
Glucose-Capillary: 257 mg/dL — ABNORMAL HIGH (ref 70–99)
Glucose-Capillary: 211 mg/dL — ABNORMAL HIGH (ref 70–99)
Glucose-Capillary: 218 mg/dL — ABNORMAL HIGH (ref 70–99)
Glucose-Capillary: 213 mg/dL — ABNORMAL HIGH (ref 70–99)
Glucose-Capillary: 243 mg/dL — ABNORMAL HIGH (ref 70–99)
Glucose-Capillary: 195 mg/dL — ABNORMAL HIGH (ref 70–99)
Glucose-Capillary: 192 mg/dL — ABNORMAL HIGH (ref 70–99)
Glucose-Capillary: 210 mg/dL — ABNORMAL HIGH (ref 70–99)
Glucose-Capillary: 225 mg/dL — ABNORMAL HIGH (ref 70–99)
Glucose-Capillary: 213 mg/dL — ABNORMAL HIGH (ref 70–99)
Glucose-Capillary: 167 mg/dL — ABNORMAL HIGH (ref 70–99)
Glucose-Capillary: 165 mg/dL — ABNORMAL HIGH (ref 70–99)
Glucose-Capillary: 162 mg/dL — ABNORMAL HIGH (ref 70–99)
Glucose-Capillary: 156 mg/dL — ABNORMAL HIGH (ref 70–99)
Glucose-Capillary: 177 mg/dL — ABNORMAL HIGH (ref 70–99)
Glucose-Capillary: 201 mg/dL — ABNORMAL HIGH (ref 70–99)
Glucose-Capillary: 184 mg/dL — ABNORMAL HIGH (ref 70–99)
Glucose-Capillary: 235 mg/dL — ABNORMAL HIGH (ref 70–99)
Glucose-Capillary: 217 mg/dL — ABNORMAL HIGH (ref 70–99)
Glucose-Capillary: 167 mg/dL — ABNORMAL HIGH (ref 70–99)

## 2023-02-20 LAB — CBC WITH DIFFERENTIAL/PLATELET
Abs Immature Granulocytes: 0.5 10*3/uL — ABNORMAL HIGH (ref 0.00–0.07)
Basophils Absolute: 0.1 10*3/uL (ref 0.0–0.1)
Basophils Relative: 1 %
Eosinophils Absolute: 0 10*3/uL (ref 0.0–0.5)
Eosinophils Relative: 0 %
HCT: 47.4 % (ref 39.0–52.0)
Hemoglobin: 17.2 g/dL — ABNORMAL HIGH (ref 13.0–17.0)
Immature Granulocytes: 2 %
Lymphocytes Relative: 4 %
Lymphs Abs: 1.1 10*3/uL (ref 0.7–4.0)
MCH: 30.4 pg (ref 26.0–34.0)
MCHC: 36.3 g/dL — ABNORMAL HIGH (ref 30.0–36.0)
MCV: 83.9 fL (ref 80.0–100.0)
Monocytes Absolute: 1.3 10*3/uL — ABNORMAL HIGH (ref 0.1–1.0)
Monocytes Relative: 5 %
Neutro Abs: 24.3 10*3/uL — ABNORMAL HIGH (ref 1.7–7.7)
Neutrophils Relative %: 88 %
Platelets: 356 10*3/uL (ref 150–400)
RBC: 5.65 MIL/uL (ref 4.22–5.81)
RDW: 12.6 % (ref 11.5–15.5)
WBC: 27.3 10*3/uL — ABNORMAL HIGH (ref 4.0–10.5)
nRBC: 0 % (ref 0.0–0.2)

## 2023-02-20 LAB — BASIC METABOLIC PANEL
Anion gap: 28 — ABNORMAL HIGH (ref 5–15)
Anion gap: 20 — ABNORMAL HIGH (ref 5–15)
Anion gap: 14 (ref 5–15)
Anion gap: 10 (ref 5–15)
Anion gap: 8 (ref 5–15)
BUN: 18 mg/dL (ref 6–20)
BUN: 15 mg/dL (ref 6–20)
BUN: 14 mg/dL (ref 6–20)
BUN: 11 mg/dL (ref 6–20)
BUN: 10 mg/dL (ref 6–20)
CO2: 8 mmol/L — ABNORMAL LOW (ref 22–32)
CO2: 13 mmol/L — ABNORMAL LOW (ref 22–32)
CO2: 18 mmol/L — ABNORMAL LOW (ref 22–32)
CO2: 20 mmol/L — ABNORMAL LOW (ref 22–32)
CO2: 21 mmol/L — ABNORMAL LOW (ref 22–32)
Calcium: 8.3 mg/dL — ABNORMAL LOW (ref 8.9–10.3)
Calcium: 8.8 mg/dL — ABNORMAL LOW (ref 8.9–10.3)
Calcium: 8.8 mg/dL — ABNORMAL LOW (ref 8.9–10.3)
Calcium: 8.2 mg/dL — ABNORMAL LOW (ref 8.9–10.3)
Calcium: 8.1 mg/dL — ABNORMAL LOW (ref 8.9–10.3)
Chloride: 92 mmol/L — ABNORMAL LOW (ref 98–111)
Chloride: 100 mmol/L (ref 98–111)
Chloride: 102 mmol/L (ref 98–111)
Chloride: 99 mmol/L (ref 98–111)
Chloride: 100 mmol/L (ref 98–111)
Creatinine, Ser: 1.33 mg/dL — ABNORMAL HIGH (ref 0.61–1.24)
Creatinine, Ser: 0.92 mg/dL (ref 0.61–1.24)
Creatinine, Ser: 0.78 mg/dL (ref 0.61–1.24)
Creatinine, Ser: 0.58 mg/dL — ABNORMAL LOW (ref 0.61–1.24)
Creatinine, Ser: 0.48 mg/dL — ABNORMAL LOW (ref 0.61–1.24)
GFR, Estimated: >60 mL/min (ref >60)
GFR, Estimated: >60 mL/min (ref >60)
GFR, Estimated: >60 mL/min (ref >60)
GFR, Estimated: >60 mL/min (ref >60)
GFR, Estimated: >60 mL/min (ref >60)
Glucose, Bld: 415 mg/dL — ABNORMAL HIGH (ref 70–99)
Glucose, Bld: 224 mg/dL — ABNORMAL HIGH (ref 70–99)
Glucose, Bld: 218 mg/dL — ABNORMAL HIGH (ref 70–99)
Glucose, Bld: 189 mg/dL — ABNORMAL HIGH (ref 70–99)
Glucose, Bld: 161 mg/dL — ABNORMAL HIGH (ref 70–99)
Potassium: 3.1 mmol/L — ABNORMAL LOW (ref 3.5–5.1)
Potassium: 2.6 mmol/L — CL (ref 3.5–5.1)
Potassium: 2.9 mmol/L — ABNORMAL LOW (ref 3.5–5.1)
Potassium: 3 mmol/L — ABNORMAL LOW (ref 3.5–5.1)
Potassium: 3.1 mmol/L — ABNORMAL LOW (ref 3.5–5.1)
Sodium: 128 mmol/L — ABNORMAL LOW (ref 135–145)
Sodium: 133 mmol/L — ABNORMAL LOW (ref 135–145)
Sodium: 134 mmol/L — ABNORMAL LOW (ref 135–145)
Sodium: 129 mmol/L — ABNORMAL LOW (ref 135–145)
Sodium: 129 mmol/L — ABNORMAL LOW (ref 135–145)

## 2023-02-20 LAB — COMPREHENSIVE METABOLIC PANEL
ALT: 13 U/L (ref 0–44)
AST: 14 U/L — ABNORMAL LOW (ref 15–41)
Albumin: 3.9 g/dL (ref 3.5–5.0)
Alkaline Phosphatase: 154 U/L — ABNORMAL HIGH (ref 38–126)
Anion gap: 31 — ABNORMAL HIGH (ref 5–15)
BUN: 17 mg/dL (ref 6–20)
CO2: 9 mmol/L — ABNORMAL LOW (ref 22–32)
Calcium: 8.7 mg/dL — ABNORMAL LOW (ref 8.9–10.3)
Chloride: 90 mmol/L — ABNORMAL LOW (ref 98–111)
Creatinine, Ser: 1.41 mg/dL — ABNORMAL HIGH (ref 0.61–1.24)
GFR, Estimated: 60 mL/min — ABNORMAL LOW (ref >60)
Glucose, Bld: 453 mg/dL — ABNORMAL HIGH (ref 70–99)
Potassium: 3.8 mmol/L (ref 3.5–5.1)
Sodium: 130 mmol/L — ABNORMAL LOW (ref 135–145)
Total Bilirubin: 2.6 mg/dL — ABNORMAL HIGH (ref 0.3–1.2)
Total Protein: 7.6 g/dL (ref 6.5–8.1)

## 2023-02-20 LAB — BETA-HYDROXYBUTYRIC ACID
Beta-Hydroxybutyric Acid: >8 mmol/L — ABNORMAL HIGH (ref 0.05–0.27)
Beta-Hydroxybutyric Acid: 1.43 mmol/L — ABNORMAL HIGH (ref 0.05–0.27)
Beta-Hydroxybutyric Acid: 0.56 mmol/L — ABNORMAL HIGH (ref 0.05–0.27)

## 2023-02-20 LAB — MAGNESIUM: Magnesium: 1.8 mg/dL (ref 1.7–2.4)

## 2023-02-20 LAB — URINALYSIS, ROUTINE W REFLEX MICROSCOPIC
Bilirubin Urine: NEGATIVE
Glucose, UA: >=500 mg/dL — AB
Ketones, ur: 80 mg/dL — AB
Leukocytes,Ua: NEGATIVE
Nitrite: NEGATIVE
Protein, ur: 100 mg/dL — AB
Specific Gravity, Urine: 1.027 (ref 1.005–1.030)
pH: 5 (ref 5.0–8.0)

## 2023-02-20 LAB — TROPONIN I (HIGH SENSITIVITY)
Troponin I (High Sensitivity): 4 ng/L (ref <18)
Troponin I (High Sensitivity): 5 ng/L (ref <18)

## 2023-02-20 LAB — MRSA NEXT GEN BY PCR, NASAL: MRSA by PCR Next Gen: NOT DETECTED

## 2023-02-20 LAB — LACTIC ACID, PLASMA
Lactic Acid, Venous: 2.9 mmol/L (ref 0.5–1.9)
Lactic Acid, Venous: 1.5 mmol/L (ref 0.5–1.9)

## 2023-02-20 LAB — CBG MONITORING, ED
Glucose-Capillary: 357 mg/dL — ABNORMAL HIGH (ref 70–99)
Glucose-Capillary: 405 mg/dL — ABNORMAL HIGH (ref 70–99)
Glucose-Capillary: 451 mg/dL — ABNORMAL HIGH (ref 70–99)
Glucose-Capillary: 472 mg/dL — ABNORMAL HIGH (ref 70–99)

## 2023-02-20 LAB — HIV ANTIBODY (ROUTINE TESTING W REFLEX): HIV Screen 4th Generation wRfx: NONREACTIVE

## 2023-02-20 LAB — PHOSPHORUS: Phosphorus: 1.7 mg/dL — ABNORMAL LOW (ref 2.5–4.6)

## 2023-02-20 LAB — LIPASE, BLOOD: Lipase: 53 U/L — ABNORMAL HIGH (ref 11–51)

## 2023-02-20 MED ORDER — VANCOMYCIN HCL 750 MG/150ML IV SOLN: 750.0000 mg | Freq: Two times a day (BID) | INTRAVENOUS | Status: DC

## 2023-02-20 MED ORDER — SODIUM CHLORIDE 0.9 % IV SOLN: 12.5000 mg | Freq: Four times a day (QID) | INTRAVENOUS | Status: DC | PRN

## 2023-02-20 MED ORDER — MORPHINE SULFATE (PF) 2 MG/ML IV SOLN: 2.0000 mg | INTRAVENOUS | Status: DC | PRN

## 2023-02-20 MED ORDER — GABAPENTIN 100 MG PO CAPS
100.0000 mg | ORAL_CAPSULE | Freq: Three times a day (TID) | ORAL | Status: DC
  Administered 2023-02-20 – 2023-02-23 (×10): 100 mg via ORAL
  Filled 2023-02-20 (×10): qty 1

## 2023-02-20 MED ORDER — INSULIN REGULAR(HUMAN) IN NACL 100-0.9 UT/100ML-% IV SOLN
INTRAVENOUS | Status: DC
  Administered 2023-02-20: 5.5 [IU]/h via INTRAVENOUS
  Administered 2023-02-20: 13 [IU]/h via INTRAVENOUS
  Filled 2023-02-20 (×2): qty 100

## 2023-02-20 MED ORDER — KCL-LACTATED RINGERS-D5W 20 MEQ/L IV SOLN
INTRAVENOUS | Status: DC
  Filled 2023-02-20 (×11): qty 1000

## 2023-02-20 MED ORDER — ACETAMINOPHEN 650 MG RE SUPP: 650.0000 mg | Freq: Four times a day (QID) | RECTAL | Status: DC | PRN

## 2023-02-20 MED ORDER — HEPARIN SODIUM (PORCINE) 5000 UNIT/ML IJ SOLN
5000.0000 [IU] | Freq: Three times a day (TID) | INTRAMUSCULAR | Status: DC
  Administered 2023-02-20 – 2023-02-23 (×10): 5000 [IU] via SUBCUTANEOUS
  Filled 2023-02-20 (×10): qty 1

## 2023-02-20 MED ORDER — POTASSIUM CHLORIDE 10 MEQ/100ML IV SOLN
10.0000 meq | INTRAVENOUS | Status: AC
  Administered 2023-02-20 (×2): 10 meq via INTRAVENOUS
  Filled 2023-02-20 (×2): qty 100

## 2023-02-20 MED ORDER — DEXTROSE 50 % IV SOLN: 0.0000 mL | INTRAVENOUS | Status: DC | PRN

## 2023-02-20 MED ORDER — POTASSIUM CHLORIDE 2 MEQ/ML IV SOLN
INTRAVENOUS | Status: DC
  Filled 2023-02-20 (×2): qty 1000

## 2023-02-20 MED ORDER — PRAVASTATIN SODIUM 40 MG PO TABS
40.0000 mg | ORAL_TABLET | Freq: Every day | ORAL | Status: DC
  Administered 2023-02-20 – 2023-02-23 (×4): 40 mg via ORAL
  Filled 2023-02-20 (×4): qty 1

## 2023-02-20 MED ORDER — PIPERACILLIN-TAZOBACTAM 3.375 G IVPB
3.3750 g | Freq: Three times a day (TID) | INTRAVENOUS | Status: DC
  Administered 2023-02-20 – 2023-02-23 (×9): 3.375 g via INTRAVENOUS
  Filled 2023-02-20 (×9): qty 50

## 2023-02-20 MED ORDER — LIDOCAINE HCL (PF) 1 % IJ SOLN
INTRAMUSCULAR | Status: AC
  Filled 2023-02-20: qty 5

## 2023-02-20 MED ORDER — SODIUM CHLORIDE 0.9 % IV SOLN
12.5000 mg | Freq: Once | INTRAVENOUS | Status: AC
  Administered 2023-02-20: 12.5 mg via INTRAVENOUS
  Filled 2023-02-20: qty 0.5

## 2023-02-20 MED ORDER — GERHARDT'S BUTT CREAM
TOPICAL_CREAM | Freq: Three times a day (TID) | CUTANEOUS | Status: DC
  Filled 2023-02-20: qty 1

## 2023-02-20 MED ORDER — LACTATED RINGERS IV SOLN: INTRAVENOUS | Status: DC

## 2023-02-20 MED ORDER — MAGNESIUM SULFATE 4 GM/100ML IV SOLN
4.0000 g | Freq: Once | INTRAVENOUS | Status: AC
  Administered 2023-02-20: 4 g via INTRAVENOUS
  Filled 2023-02-20: qty 100

## 2023-02-20 MED ORDER — VANCOMYCIN HCL IN DEXTROSE 1-5 GM/200ML-% IV SOLN
1000.0000 mg | Freq: Two times a day (BID) | INTRAVENOUS | Status: DC
  Administered 2023-02-20 – 2023-02-23 (×6): 1000 mg via INTRAVENOUS
  Filled 2023-02-20 (×6): qty 200

## 2023-02-20 MED ORDER — POTASSIUM PHOSPHATES 15 MMOLE/5ML IV SOLN
30.0000 mmol | Freq: Once | INTRAVENOUS | Status: AC
  Administered 2023-02-20: 30 mmol via INTRAVENOUS
  Filled 2023-02-20: qty 10

## 2023-02-20 MED ORDER — POTASSIUM CHLORIDE 10 MEQ/100ML IV SOLN
10.0000 meq | INTRAVENOUS | Status: AC
  Administered 2023-02-20 (×3): 10 meq via INTRAVENOUS
  Filled 2023-02-20 (×3): qty 100

## 2023-02-20 MED ORDER — LACTATED RINGERS IV BOLUS
20.0000 mL/kg | Freq: Once | INTRAVENOUS | Status: AC
  Administered 2023-02-20: 1496 mL via INTRAVENOUS

## 2023-02-20 MED ORDER — CHLORHEXIDINE GLUCONATE CLOTH 2 % EX PADS
6.0000 | MEDICATED_PAD | Freq: Every day | CUTANEOUS | Status: DC
  Administered 2023-02-20 – 2023-02-23 (×4): 6 via TOPICAL

## 2023-02-20 MED ORDER — MORPHINE SULFATE (PF) 2 MG/ML IV SOLN
2.0000 mg | INTRAVENOUS | Status: DC | PRN
  Administered 2023-02-20 – 2023-02-23 (×15): 2 mg via INTRAVENOUS
  Filled 2023-02-20 (×15): qty 1

## 2023-02-20 MED ORDER — ACETAMINOPHEN 325 MG PO TABS
650.0000 mg | ORAL_TABLET | Freq: Four times a day (QID) | ORAL | Status: DC | PRN
  Administered 2023-02-21: 650 mg via ORAL
  Filled 2023-02-20: qty 2

## 2023-02-20 MED ORDER — VANCOMYCIN HCL 1500 MG/300ML IV SOLN
1500.0000 mg | Freq: Once | INTRAVENOUS | Status: AC
  Administered 2023-02-20: 1500 mg via INTRAVENOUS
  Filled 2023-02-20: qty 300

## 2023-02-20 MED ORDER — OXYCODONE HCL 5 MG PO TABS
5.0000 mg | ORAL_TABLET | ORAL | Status: DC | PRN
  Administered 2023-02-21 – 2023-02-23 (×8): 5 mg via ORAL
  Filled 2023-02-20 (×9): qty 1

## 2023-02-20 MED ORDER — DEXTROSE IN LACTATED RINGERS 5 % IV SOLN: INTRAVENOUS | Status: DC

## 2023-02-20 MED ORDER — PROMETHAZINE HCL 25 MG/ML IJ SOLN
INTRAMUSCULAR | Status: AC
  Filled 2023-02-20: qty 1

## 2023-02-20 MED ORDER — PIPERACILLIN-TAZOBACTAM 3.375 G IVPB
3.3750 g | Freq: Once | INTRAVENOUS | Status: AC
  Administered 2023-02-20: 3.375 g via INTRAVENOUS
  Filled 2023-02-20: qty 50

## 2023-02-20 MED ORDER — PROMETHAZINE HCL 12.5 MG PO TABS: 12.5000 mg | ORAL_TABLET | Freq: Four times a day (QID) | ORAL | Status: DC | PRN

## 2023-02-20 MED ORDER — LIDOCAINE HCL (CARDIAC) PF 100 MG/5ML IV SOSY
PREFILLED_SYRINGE | INTRAVENOUS | Status: AC
  Filled 2023-02-20: qty 5

## 2023-02-20 MED ORDER — IOHEXOL 350 MG/ML SOLN
100.0000 mL | Freq: Once | INTRAVENOUS | Status: AC | PRN
  Administered 2023-02-20: 100 mL via INTRAVENOUS

## 2023-02-20 NOTE — TOC Initial Note (Signed)
Transition of Care [facility]) - Initial/Assessment Note    Patient Details  Name: DEMAREO DAVISON MRN: [id number] Date of Birth: Jun 30, 1971  [facility] CM/SW Contact:    Ihor Gully, LCSW Phone Number: 02/20/2023, 1:06 PM  Clinical Narrative:                 Patient from home, admitted for DKA. Unemployed, uninsured. Has not been taking medications due to lack of insurance. Will likely need a MATCH voucher. Patient is agreeable to [facility] referral. Referral message left for [facility] and secure referral email sent to Mayra Reel with [facility].  Expected Discharge Plan: Home/Self Care Barriers to Discharge: Continued Medical Work up   Patient Goals and CMS Choice            Expected Discharge Plan and Services                                              Prior Living Arrangements/Services     Patient language and need for interpreter reviewed:: Yes Do you feel safe going back to the place where you live?: Yes      Need for Family Participation in Patient Care: Yes (Comment) Care giver support system in place?: Yes (comment)   Criminal Activity/Legal Involvement Pertinent to Current Situation/Hospitalization: No - Comment as needed  Activities of Daily Living Home Assistive Devices/Equipment: None ADL Screening (condition at time of admission) Patient's cognitive ability adequate to safely complete daily activities?: Yes Is the patient deaf or have difficulty hearing?: No Does the patient have difficulty seeing, even when wearing glasses/contacts?: No Does the patient have difficulty concentrating, remembering, or making decisions?: No Patient able to express need for assistance with ADLs?: Yes Does the patient have difficulty dressing or bathing?: No Independently performs ADLs?: Yes (appropriate for developmental age) Does the patient have difficulty walking or climbing stairs?: No Weakness of Legs: None Weakness of  Arms/Hands: None  Permission Sought/Granted                  Emotional Assessment     Affect (typically observed): Appropriate Orientation: : Oriented to Self, Oriented to Place, Oriented to  Time, Oriented to Situation Alcohol / Substance Use: Not Applicable    Admission diagnosis:  DKA (diabetic ketoacidosis) ([place]) [E11.10] Diabetic ketoacidosis without coma associated with type 2 diabetes mellitus ([place]) [E11.10] Chest pain, unspecified type [R07.9] Nausea and vomiting, unspecified vomiting type [R11.2] Patient Active Problem List   Diagnosis Date Noted   Severe DKA (diabetic ketoacidosis) 02/20/2023   Scrotal cellulitis and abscess 02/20/2023   Prolonged QT interval 02/20/2023   AKI (acute kidney injury) ([place]) 02/20/2023   Hyperlipidemia 02/20/2023   Dehydration 02/20/2023   Sepsis due to cellulitis 02/20/2023   DM (diabetes mellitus) type 2, uncontrolled, with ketoacidosis ([place]) 02/20/2023   PCP:  Wilburt Finlay, MD Pharmacy:   [id number] - [place], [place] [place] [address] Phone: [phone] Fax: [phone]     Social Determinants of Health (SDOH) Social History: SDOH Screenings   Food Insecurity: No Food Insecurity (02/20/2023)  Housing: Low Risk  (02/20/2023)  Transportation Needs: No Transportation Needs (02/20/2023)  Utilities: At Risk (02/20/2023)  Tobacco Use: Medium Risk (02/19/2023)   SDOH Interventions:     Readmission Risk Interventions  No data to display

## 2023-02-20 NOTE — Inpatient Diabetes Management (Signed)
Inpatient Diabetes Program Recommendations  AACE/ADA: New Consensus Statement on Inpatient Glycemic Control (2015)  Target Ranges:  Prepandial:   less than 140 mg/dL      Peak postprandial:   less than 180 mg/dL (1-2 hours)      Critically ill patients:  140 - 180 mg/dL   Lab Results  Component Value Date   GLUCAP 210 (H) 02/20/2023   HGBA1C 14.8 01/05/2022    Review of Glycemic Control  Latest Reference Range & Units 02/20/23 09:01 02/20/23 10:04 02/20/23 11:03  Glucose-Capillary 70 - 99 mg/dL 195 (H) 192 (H) 210 (H)  (H): Data is abnormally high Diabetes history: Type 2 Dm Outpatient Diabetes medications: Glipizide 5 mg QD, Metformin 1000 mg BID, Ozempic 1 mg Qwk, Lantus 36 units QD Current orders for Inpatient glycemic control: IV insulin  Inpatient Diabetes Program Recommendations:    When ready to transition consider: -Semglee 15 units two hours prior to discontinuation than QD to follow  (vs Novolog 70/30 at transition) -Novolog 0-9 units TID & HS  Spoke with patient regarding outpatient diabetes management. Patient recently saw Loree Fee, NP outpatient endocrinology on 02/14/23. Patient was unable to pick up prescriptions due to lack of insurance and cost.  Reviewed patient's current A1c of 14.8%. Explained what a A1c is and what it measures. Also reviewed goal A1c with patient, importance of good glucose control @ home, and blood sugar goals. Reviewed patho of DM, need for improved control, survival skills, interventions, 70/30 compared to current regimen, relion products, vascular changes, DKA and other commorbidities.  Patient has a meter and testing supplies. Reviewed when to reach out to MD and to ask questions regarding to access if becomes an issues. Will place [facility] consult.  May need to consider Novolin 70/30 for outpatient discharge plan. Patient reports he is able to afford. Secure chat sent to MD.   Thanks, Bronson Curb, MSN, RNC-OB Diabetes  Coordinator [phone] (8a-5p)

## 2023-02-20 NOTE — Progress Notes (Signed)
PROGRESS NOTE   Bradley Miller  [id number] DOB: [birth year] DOA: 02/19/2023 PCP: Wilburt Finlay, MD   Chief Complaint  Patient presents with   Emesis   Level of care: ICU  Brief Admission History:  52 y.o. male with medical history significant of diabetes mellitus type 2, hyperlipidemia, presents the ED with a chief complaints of nausea and vomiting.  Patient reports that he has had dehydration, nausea, vomiting, somnolence, feeling of being off balance, that started 4 days ago.  He reports it was gradual in onset.  He has had polyuria and polydipsia.  He reports his oxygen sats have been low at home, but he has not felt short of breath.  He does not wear oxygen at home.  He is requiring oxygen in the ER.  Patient reports that he has been fighting indigestion for a while.  He is not on any medications for it.  He is not on any medications at all at home, because he has had financial barriers to obtaining his medications.  Patient reports he has had nausea/vomiting up to 9 times per day.  There is no hematemesis.  He reports associated burning indigestion pain.  The pain is at the top of his stomach and in his esophagus.  The pain is intermittent.  His last normal meal was 2 days ago.  His last p.o. ingestion was chicken noodles soup yesterday.  He has not had any diarrhea.  Patient reports he has not been checking his sugars at home because he does not have any test strips.  He has not been taking insulin at least 2 months.  He reports associated weight loss of approximately 25 pounds.  He has no other complaints at this time.   Patient is a longtime smoker.  He reports he quit 1 week ago.  He says that he does not need nicotine patch.  He does not drink alcohol, does not use illicit drugs.  He is not vaccinated for COVID or flu.  Patient is full code.   Assessment and Plan: * Severe DKA (diabetic ketoacidosis) - pH 6.99, bicarb 9, glucose 453, gap 31 - DKA treatment protocol started per  [facility] - Patient previously had taken 36 units of basal insulin at baseline, as well as glipizide, metformin, and Ozempic, but patient has not had medication in months due to losing job and insurance benefits - He has not been checking his glucose at home because he is been out of test strips - Consult TOC for assistance with resources for medications, outpatient follow up and treatments.  - N.p.o. - Triggered by scrotal cellulitis and abscess infection - Continue to monitor for resolution of acidosis and plan to transition to subcutaneous insulin   Scrotal cellulitis and abscess - wound culture sent from drainage - I have consulted Dr. Jeffie Pollock with [facility] regarding suspected need for I&D  - added Zosyn to vancomycin for anaerobic and pseudomonas coverage  - CT pelvis and scrotal US suggesting abscess  - working on glycemic control as noted   DM (diabetes mellitus) type 2, uncontrolled, with ketoacidosis ([place]) -- as evidenced by a previous A1c>14% -- current A1c pending -- pt has not been able to afford his medication -- TOC consulted for assistance with medication and outpatient follow up  Sepsis due to cellulitis - Heart rate 125, respiratory rate 22, leukocytosis 32.8 - AKI and lactic acidosis - Suspected source is infected scrotal wounds - vancomycin started, add Zosyn for anaerobic and pseudomonas coverage  -  CT pelvis and scrotal US ordered  - Continue IV fluid  - Lactic acidosis resolved  - Blood culture pending--NGTD  - Continue to monitor  Dehydration - Secondary to DKA - With AKI and lactic acidosis - Continue fluids  Hyperlipidemia - secondary to uncontrolled diabetes mellitus -- Continue pravastatin and working on glycemic control   AKI (acute kidney injury) ([place]) - Creatinine increased from 0.9>> 1.41 - Due to hypovolemia in the setting of DKA - Continue IV fluids - Hold nephrotoxic agents when possible - Follow  Prolonged QT interval - QTc  673 - Likely due to acidosis - Continue treatment of DKA and dehydration with IV fluids - Monitor on telemetry - Continue to monitor  DVT prophylaxis: [place] heparin  Code Status: Full  Family Communication:  Disposition: Status is: Inpatient Remains inpatient appropriate because: IV fluids, IV insulin, IV antibiotics required    Consultants:  Urology Dr. Jeffie Pollock   Procedures:  TBD Antimicrobials:  Zosyn 3/28>> Vancomycin 3/28>>   Subjective: Pt complains of scrotal pain, nausea and emesis  Objective: Vitals:   02/20/23 0900 02/20/23 1000 02/20/23 1100 02/20/23 1121  BP: (!) 144/93 132/78 137/85   Pulse: 96 88 91   Resp: 20 16 12    Temp:    97.7 F (36.5 C)  TempSrc:    Oral  SpO2: 99% 100% 100%   Weight:      Height:        Intake/Output Summary (Last 24 hours) at 02/20/2023 1201 Last data filed at 02/20/2023 1158 Gross per 24 hour  Intake 4243.73 ml  Output 2050 ml  Net 2193.73 ml   Filed Weights   02/19/23 2302 02/20/23 0404  Weight: 74.8 kg 67.8 kg   Examination:  General exam: cachectic appearing, chronically ill appearing male, Appears calm and comfortable  Respiratory system: Clear to auscultation. Respiratory effort normal. Cardiovascular system: normal S1 & S2 heard. No JVD, murmurs, rubs, gallops or clicks. No pedal edema. Gastrointestinal system: Abdomen is nondistended, soft and nontender. No organomegaly or masses felt. Normal bowel sounds heard. 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      Central nervous system: Alert and oriented. No focal neurological deficits. Extremities: Symmetric 5 x 5 power. Skin: No rashes, lesions or ulcers. Psychiatry: Judgement and insight appear normal. Mood & affect appropriate.   Data Reviewed: I have personally reviewed following labs and imaging studies  CBC: Recent Labs  Lab 02/19/23 2303 02/20/23 0436  WBC 32.8* 27.3*  NEUTROABS  --   24.3*  HGB 19.3* 17.2*  HCT 56.6* 47.4  MCV 90.3 83.9  PLT 530* [id number]    Basic Metabolic Panel: Recent Labs  Lab 02/19/23 2303 02/20/23 0057 02/20/23 0436 02/20/23 0850  NA 130* 128* 133* 134*  K 3.8 3.1* 2.6* 2.9*  CL 90* 92* 100 102  CO2 9* 8* 13* 18*  GLUCOSE 453* 415* 224* 218*  BUN 17 18 15 14   CREATININE 1.41* 1.33* 0.92 0.78  CALCIUM 8.7* 8.3* 8.8* 8.8*  MG  --   --  1.8  --   PHOS  --   --  1.7*  --     CBG: Recent Labs  Lab 02/20/23 0650 02/20/23 0758 02/20/23 0901 02/20/23 1004 02/20/23 1103  GLUCAP 213* 243* 195* 192* 210*    Recent Results (from the past 240 hour(s))  MRSA Next Gen by PCR, Nasal     Status: None   Collection Time: 02/20/23  2:28 AM   Specimen: Nasal Mucosa; Nasal Swab  Result Value Ref Range Status   MRSA by PCR Next Gen NOT DETECTED NOT DETECTED Final    Comment: (NOTE) The GeneXpert MRSA Assay (FDA approved for NASAL specimens only), is one component of a comprehensive MRSA colonization surveillance program. It is not intended to diagnose MRSA infection nor to guide or monitor treatment for MRSA infections. Test performance is not FDA approved in patients less than 61 years old. Performed at [facility], [address] [place], [place] [address]   Culture, blood (Routine X 2) w Reflex to ID Panel     Status: None (Preliminary result)   Collection Time: 02/20/23  4:35 AM   Specimen: BLOOD  Result Value Ref Range Status   Specimen Description BLOOD BLOOD RIGHT ARM  Final   Special Requests   Final    BOTTLES DRAWN AEROBIC AND ANAEROBIC Blood Culture adequate volume   Culture   Final    NO GROWTH < 12 HOURS Performed at [facility], [address], [place], [place] [address]    Report Status PENDING  Incomplete  Culture, blood (Routine X 2) w Reflex to ID Panel     Status: None (Preliminary result)   Collection Time: 02/20/23  4:36 AM   Specimen: BLOOD  Result Value Ref Range Status   Specimen Description BLOOD BLOOD RIGHT  HAND  Final   Special Requests   Final    BOTTLES DRAWN AEROBIC AND ANAEROBIC Blood Culture adequate volume   Culture   Final    NO GROWTH < 12 HOURS Performed at [facility], [address], [place], [place] [address]    Report Status PENDING  Incomplete     Radiology Studies: US SCROTUM  Result Date: 02/20/2023 CLINICAL DATA:  Two-week history of left scrotal abscess with 1 day history of drainage EXAM: ULTRASOUND OF SCROTUM TECHNIQUE: Complete ultrasound examination of the testicles, epididymis, and other scrotal structures was performed. COMPARISON:  CT pelvis dated 02/12/2023 FINDINGS: Right testicle Measurements: 4.0 x 2.3 x 2.3 cm, 10.9 mL. No mass or microlithiasis visualized. Left testicle Measurements: 4.5 x 2.4 x 2.1 cm, 11.6 mL. No mass or microlithiasis visualized. Asymmetric, heterogeneously hypoechoic soft tissue thickening overlying the left testicle associated with asymmetric hyperemia. Focal, heterogeneously hypoechoic area within the scrotal soft tissue overlying the epididymal head measures 2.5 x 2.1 cm. Right epididymis:  Normal in size and appearance. Left epididymis:  Normal in size and appearance. Hydrocele:  None visualized. Varicocele:  None visualized. IMPRESSION: 1. Asymmetric, heterogeneously hypoechoic soft tissue thickening overlying the left testicle associated with asymmetric hyperemia, likely cellulitis. 2. Focal, heterogeneously hypoechoic area within the scrotal soft tissue overlying the left epididymal head measures 2.5 x 2.1 cm, likely reflecting phlegmon/developing abscess. 3. Normal sonographic appearance of the testicles. Electronically Signed   By: Darrin Nipper M.D.   On: 02/20/2023 11:00   CT PELVIS WO CONTRAST  Result Date: 02/20/2023 CLINICAL DATA:  Soft tissue infection suspected, wounds on scrotum from ingrown hairs. EXAM: CT PELVIS WITHOUT CONTRAST TECHNIQUE: Multidetector CT imaging of the pelvis was performed following the standard protocol without  intravenous contrast. RADIATION DOSE REDUCTION: This exam was performed according to the departmental dose-optimization program which includes automated exposure control, adjustment of the mA and/or kV according to patient size and/or use of iterative reconstruction technique. COMPARISON:  [id number]. FINDINGS: Urinary Tract:  No abnormality visualized. Bowel:  Unremarkable visualized pelvic bowel loops. Vascular/Lymphatic: Prominent lymph nodes are present in the  inguinal region on the left, likely reactive. No significant vascular abnormality seen. Reproductive:  The prostate gland is within normal limits. Other: Increased density is noted in the scrotal wall. There are hypodense regions in the anterior aspect of the scrotum measuring 1.9 cm and 1.0 cm. There is skin thickening along the medial proximal left thigh with presumed packing material in this region, limiting evaluation. No subcutaneous emphysema is seen. Musculoskeletal: Degenerative changes are present in the lower lumbar spine. No acute osseous abnormality. IMPRESSION: Increased density in the scrotal wall with hypodense regions in the anterior scrotum measuring 1.9 and 1.0 cm, possible phlegmon or developing abscesses. No subcutaneous emphysema. Ultrasound is recommended for further evaluation. Electronically Signed   By: Brett Fairy M.D.   On: 02/20/2023 04:16   CT Angio Chest/Abd/Pel for Dissection W and/or Wo Contrast  Result Date: 02/20/2023 CLINICAL DATA:  Nausea and vomiting with chest pain for several hours, initial encounter EXAM: CT ANGIOGRAPHY CHEST, ABDOMEN AND PELVIS TECHNIQUE: Non-contrast CT of the chest was initially obtained. Multidetector CT imaging through the chest, abdomen and pelvis was performed using the standard protocol during bolus administration of intravenous contrast. Multiplanar reconstructed images and MIPs were obtained and reviewed to evaluate the vascular anatomy. RADIATION DOSE REDUCTION: This exam was performed  according to the departmental dose-optimization program which includes automated exposure control, adjustment of the mA and/or kV according to patient size and/or use of iterative reconstruction technique. CONTRAST:  11mL OMNIPAQUE IOHEXOL 350 MG/ML SOLN COMPARISON:  Chest x-ray from the previous day. FINDINGS: CTA CHEST FINDINGS Cardiovascular: Initial precontrast images show no hyperdense crescent to suggest acute aortic injury. Atherosclerotic calcifications are noted. Post-contrast images demonstrate no evidence of aneurysmal dilatation or dissection. Heart is not significantly enlarged. No large central pulmonary embolus is noted although not timed for embolus evaluation. Mediastinum/Nodes: Thoracic inlet is within normal limits. No hilar or mediastinal adenopathy is noted. The esophagus as visualized is within normal limits. Lungs/Pleura: Lungs are well aerated bilaterally. No focal infiltrate or sizable effusion is seen. No sizable parenchymal nodules are noted. Small calcified granuloma is noted in the left lower lobe. Musculoskeletal: No acute rib abnormality is noted. Mild degenerative changes of the thoracic spine are seen. Review of the MIP images confirms the above findings. CTA ABDOMEN AND PELVIS FINDINGS VASCULAR Aorta: Normal caliber aorta without aneurysm, dissection, vasculitis or significant stenosis. Celiac: Patent without evidence of aneurysm, dissection, vasculitis or significant stenosis. SMA: Patent without evidence of aneurysm, dissection, vasculitis or significant stenosis. Renals: Both renal arteries are patent without evidence of aneurysm, dissection, vasculitis, fibromuscular dysplasia or significant stenosis. IMA: Patent without evidence of aneurysm, dissection, vasculitis or significant stenosis. Inflow: Iliacs are within normal limits. Veins: No specific venous abnormality is noted. Review of the MIP images confirms the above findings. NON-VASCULAR Hepatobiliary: No focal liver  abnormality is seen. No gallstones, gallbladder wall thickening, or biliary dilatation. Pancreas: Unremarkable. No pancreatic ductal dilatation or surrounding inflammatory changes. Spleen: Normal in size without focal abnormality. Adrenals/Urinary Tract: Adrenal glands are within normal limits. Kidneys are well visualized bilaterally. No renal calculi or obstructive changes are seen. The bladder is well distended. Stomach/Bowel: No obstructive or inflammatory changes of the colon are seen. The appendix is well visualized and within normal limits. Small bowel and stomach are unremarkable. Lymphatic: No lymphadenopathy is noted. Reproductive: Prostate is unremarkable. Other: No abdominal wall hernia or abnormality. No abdominopelvic ascites. Musculoskeletal: No acute or significant osseous findings. Review of the MIP images confirms the above findings. IMPRESSION:  No evidence of acute aortic dissection or aneurysmal dilatation. No evidence of pulmonary emboli. Changes of prior granulomatous disease. No other focal abnormality is noted. Electronically Signed   By: Inez Catalina M.D.   On: 02/20/2023 00:40   DG Chest Port 1 View  Result Date: 02/19/2023 CLINICAL DATA:  Shortness of breath EXAM: PORTABLE CHEST 1 VIEW COMPARISON:  None Available. FINDINGS: No acute airspace disease. Normal cardiac size. No pneumothorax. Probable emphysema. IMPRESSION: No active disease. Electronically Signed   By: Donavan Foil M.D.   On: 02/19/2023 23:26    Scheduled Meds:  Chlorhexidine Gluconate Cloth  6 each Topical Daily   gabapentin  100 mg Oral TID   Gerhardt's butt cream   Topical TID   heparin  5,000 Units Subcutaneous Q8H   pravastatin  40 mg Oral Daily   Continuous Infusions:  dextrose 5% lactated ringers with KCl 20 mEq/L 150 mL/hr at 02/20/23 1158   insulin 5.5 Units/hr (02/20/23 1158)   piperacillin-tazobactam (ZOSYN)  IV     potassium chloride 100 mL/hr at 02/20/23 1158   potassium PHOSPHATE IVPB (in mmol)      promethazine (PHENERGAN) injection (IM or IVPB)     vancomycin       LOS: 0 days   Critical Care Procedure Note Authorized and Performed by: Murvin Natal MD  Total Critical Care time:  55 mins  Due to a high probability of clinically significant, life threatening deterioration, the patient required my highest level of preparedness to intervene emergently and I personally spent this critical care time directly and personally managing the patient.  This critical care time included obtaining a history; examining the patient, pulse oximetry; ordering and review of studies; arranging urgent treatment with development of a management plan; evaluation of patient's response of treatment; frequent reassessment; and discussions with other providers.  This critical care time was performed to assess and manage the high probability of imminent and life threatening deterioration that could result in multi-organ failure.  It was exclusive of separately billable procedures and treating other patients and teaching time.    Irwin Brakeman, MD How to contact the [facility] Attending or Consulting provider [place] or covering provider during after hours [place], for this patient?  Check the care team in [facility] and look for a) attending/consulting [facility] provider listed and b) the [facility] team listed Log into [email or web address] and use [place]'s universal password to access. If you do not have the password, please contact the hospital operator. Locate the [facility] provider you are looking for under [facility] and page to a number that you can be directly reached. If you still have difficulty reaching the provider, please page the [facility] (Director on Call) for the Hospitalists listed on amion for assistance.  02/20/2023, 12:01 PM

## 2023-02-20 NOTE — Assessment & Plan Note (Addendum)
--   TREATED AND RESOLVED NOW - presented with pH 6.99, bicarb 9, glucose 453, gap 31 - DKA treatment protocol started per [facility] - Patient previously had taken 36 units of basal insulin at baseline, as well as glipizide, metformin, and Ozempic, but patient has not had medication in months due to losing job and insurance benefits - DKA physiology and metabolic acidosis resolved  - treated with levemir 25 units BID plus novolog 10 units TID with meals; added metformin ER 500 mg with supper   -- unfortunately due to having no insurance, we will discharge him on a plan that is less costly to patient out of pocket. Discussed with Education officer, museum.  DC on novolin (relion) 70/30 flexpen 15 units BID with meals, metformin ER 500 mg - titrate up to 1000 mg BID; titrate insulin outpatient for better glycemic control; close monitoring of CBG strongly advised and RX for testing supplies provided;

## 2023-02-20 NOTE — Consult Note (Signed)
[facility] Nurse Consult Note: Reason for Consult:two full thickness wounds to scrotum. RN reports that patient relayed to her that these have been present for "a while" that they occur when he "shaves" the affected area.No photos present in the EMR. Patient with uncontrolled DM, nausea and vomiting, epigastric pain, pharmacy has been consulted for antibiotic coverage. Wound type:infectious Pressure Injury POA: N/A Measurement: Per Nursing: Right upper scrotum: 0.4cm x 1cm with no depth drainage scant purulent exudate, Left lower scrotum: 2cm x 1.8cm area of full thickness skin injury with 90% black tissue, 10% red tissue. No exudate Wound bed:As noted above Drainage (amount, consistency, odor) As noted above Periwound: erythema Dressing procedure/placement/frequency: I will provide conservative guidance for the care of these wounds using Gerhart's Butt cream a 1:1:1 compounded product consisting of lotrimin antifungal cream:hydrocortisone cream:zinc oxide. This is to be applied three times daily after cleansing with soap and water. Patient is not to ear briefs while in bed and the Nursing staff advised to use only DermaTherapy bed linens (antimicrobial, low CoF) beneath patient. He is to be encouraged to lie side to side while in bed to allow for enhanced healing of the affected areas.  A CT of the pelvis has been ordered.  Recommend consult with Urology for these wounds to rule out Fournier's Gangrene or other soft tissue infection.If you agree, please initiate consult request.  [facility] nursing team will not follow, but will remain available to this patient, the nursing and medical teams.  Please re-consult if needed.  Thank you for inviting [place] to participate in this patient's Plan of Care.  Maudie Flakes, MSN, RN, CNS, [place], Serita Grammes, [employer], [employer] phone:  [phone]

## 2023-02-20 NOTE — Progress Notes (Addendum)
Pt arrived to unit via stretcher on tele with insulin infusing. Pt complains of nausea, and CP pain. Pt states that he's out of work and hasn't had his RX meds in a couple months, pt also states the his electric is about to be shut off any day and he just got his water turned back on. Pt states he feels safe at home.  Pt has 2 wounds on his scrotum, pt states he got ingrown hairs from shaving, wounds are measured and charted. Pt states they are tender but have been there. Notified Dr. Earnest Conroy.

## 2023-02-20 NOTE — Progress Notes (Addendum)
Pharmacy Antibiotic Note  Bradley Miller is a 52 y.o. male admitted on 02/19/2023 with  wound infection .  Pharmacy has been consulted for vancomycin and Zosyn dosing.  Plan: Vancomycin 1500mg  x1 then Vancomycin 1000 mg IV Q12H. Goal AUC 400-550.  Zosyn 3.375g IV every 8 hours.  Height: 6\' 1"  (185.4 cm) Weight: 67.8 kg (149 lb 7.6 oz) IBW/kg (Calculated) : 79.9  Temp (24hrs), Avg:98.2 F (36.8 C), Min:97.8 F (36.6 C), Max:98.7 F (37.1 C)  Recent Labs  Lab 02/19/23 2303 02/19/23 2312 02/20/23 0057 02/20/23 0436 02/20/23 0850  WBC 32.8*  --   --  27.3*  --   CREATININE 1.41*  --  1.33* 0.92 0.78  LATICACIDVEN  --  2.9*  --  1.5  --      Estimated Creatinine Clearance: 103.6 mL/min (by C-G formula based on SCr of 0.78 mg/dL).    Allergies  Allergen Reactions   Morphine Itching    Side effect Side effect     Thank you for allowing pharmacy to be a part of this patient's care.  Wynona Neat, PharmD, BCPS  02/20/2023 9:20 AM

## 2023-02-20 NOTE — H&P (Signed)
History and Physical    Patient: Bradley Miller [id number] DOB: 1971/06/25 DOA: 02/19/2023 DOS: the patient was seen and examined on 02/20/2023 PCP: Wilburt Finlay, MD  Patient coming from: Home  Chief Complaint:  Chief Complaint  Patient presents with   Emesis   HPI: Bradley Miller is a 52 y.o. male with medical history significant of diabetes mellitus type 2, hyperlipidemia, presents the ED with a chief complaints of nausea and vomiting.  Patient reports that he has had dehydration, nausea, vomiting, somnolence, feeling of being off balance, that started 4 days ago.  He reports it was gradual in onset.  He has had polyuria and polydipsia.  He reports his oxygen sats have been low at home, but he has not felt short of breath.  He does not wear oxygen at home.  He is requiring oxygen in the ER.  Patient reports that he has been fighting indigestion for a while.  He is not on any medications for it.  He is not on any medications at all at home, because he has had financial barriers to obtaining his medications.  Patient reports he has had nausea/vomiting up to 9 times per day.  There is no hematemesis.  He reports associated burning indigestion pain.  The pain is at the top of his stomach and in his esophagus.  The pain is intermittent.  His last normal meal was 2 days ago.  His last p.o. ingestion was chicken noodles soup yesterday.  He has not had any diarrhea.  Patient reports he has not been checking his sugars at home because he does not have any test strips.  He has not been taking insulin at least 2 months.  He reports associated weight loss of approximately 25 pounds.  He has no other complaints at this time.  Patient does not smoke.  He reports he quit 1 week ago.  He does not need nicotine patch.  He does not drink alcohol, does not use illicit drugs.  He is not vaccinated for COVID or flu.  Patient is full code. Review of Systems: As mentioned in the history of present illness. All  other systems reviewed and are negative. Past Medical History:  Diagnosis Date   Diabetes mellitus without complication ([place])    Past Surgical History:  Procedure Laterality Date   ANKLE FRACTURE SURGERY     Social History:  reports that he has quit smoking. His smoking use included cigarettes. He has been exposed to tobacco smoke. He has quit using smokeless tobacco. He reports current alcohol use. He reports that he does not use drugs.  Allergies  Allergen Reactions   Morphine Itching    Side effect Side effect     History reviewed. No pertinent family history.  Prior to Admission medications   Medication Sig Start Date End Date Taking? Authorizing Provider  Continuous Blood Gluc Sensor (FREESTYLE LIBRE 2 SENSOR) MISC Change sensor every 14 days. 02/13/22   Brita Romp, NP  gabapentin (NEURONTIN) 100 MG capsule Take 100 mg by mouth 3 (three) times daily. 01/04/22   [provider]  glipiZIDE (GLUCOTROL) 10 MG tablet Take 0.5 tablets (5 mg total) by mouth daily before breakfast. 01/29/22   Brita Romp, NP  insulin glargine (LANTUS SOLOSTAR) 100 UNIT/ML Solostar Pen Inject 36 Units into the skin daily at 10 pm. 03/12/22   Brita Romp, NP  Insulin Pen Needle (BD PEN NEEDLE NANO U/F) 32G X 4 MM MISC Use to inject  insulin once daily 10/27/20   [provider]  lisinopril (ZESTRIL) 2.5 MG tablet Take 2.5 mg by mouth daily. 01/03/22   [provider]  metFORMIN (GLUCOPHAGE) 1000 MG tablet 2 (two) times daily with a meal. 01/03/22   [provider]  OZEMPIC, 1 MG/DOSE, 4 MG/3ML SOPN Inject 1 mg into the skin once a week. 02/13/22   Brita Romp, NP  pravastatin (PRAVACHOL) 40 MG tablet Take 1 tablet by mouth daily. 01/03/22   [provider]    Physical Exam: Vitals:   02/20/23 0130 02/20/23 0145 02/20/23 0200 02/20/23 0236  BP: (!) 118/92  123/89 139/89  Pulse: (!) 117 (!) 108 (!) 108 (!) 107  Resp: 20 (!) 22 (!) 24 17   Temp:    97.8 F (36.6 C)  TempSrc:    Oral  SpO2: 100% 100% 100% 100%  Weight:      Height:       1.  General: Patient lying supine in bed, chronically ill-appearing   2. Psychiatric: Alert and oriented x 3, mood and behavior normal for situation, pleasant and cooperative with exam   3. Neurologic: Speech and language are normal, face is symmetric, moves all 4 extremities voluntarily, at baseline without acute deficits on limited exam   4. HEENMT:  Head is atraumatic, normocephalic, pupils reactive to light, neck is supple, trachea is midline, mucous membranes are moist   5. Respiratory : Barrel chested, lungs are clear to auscultation bilaterally without wheezing, rhonchi, rales, no cyanosis, no increase in work of breathing or accessory muscle use   6. Cardiovascular : Heart rate tachycardic, rhythm is regular, no murmurs, rubs or gallops, no peripheral edema, peripheral pulses palpated   7. Gastrointestinal:  Patient actively vomiting, abdomen is soft, nondistended, nontender to palpation bowel sounds active, no masses or organomegaly palpated   8. Skin:  Scrotal wounds with tenderness and purulent drainage   9.Musculoskeletal:  No acute deformities or trauma, no asymmetry in tone, no peripheral edema, peripheral pulses palpated, no tenderness to palpation in the extremities  Data Reviewed: In the ED Temp 98.1, heart rate 116-125, respiratory rate 21-22, blood pressure 106/88-134/94, satting 99-100% Leukocytosis at 32.8, hemoglobin 19.3, platelets [id number] Metabolic acidosis with a bicarb of 9, AKI with a creatinine of 1.41, glucose of 453 pH 6.99 Lipase 53 Chest x-ray shows no active disease EKG shows a heart rate of 115, sinus tach, QTc 673 Lactic acid 2.9 Patient given Dilaudid, 10 units NovoLog, started on insulin drip in the ED LR 2.496 L bolus in the ED LR 125 mill per hour continued Zofran, Phenergan, bicarb, potassium given in the ED Admission requested for  DKA On admission found to have purulent wounds on scrotum  Assessment and Plan: * DKA (diabetic ketoacidosis) (HCC) - pH 6.99, bicarb 9, glucose 453, gap 31 - DKA protocol started - Patient is given 10 units of NovoLog prior to starting insulin drip in the ER - Patient takes 36 units of basal insulin at baseline, as well as glipizide, metformin, and Ozempic, but patient has not had medication in months per his report - He has not been checking his glucose at home because he is been out of test strips - Consult TOC - N.p.o. - Possibly triggered by infection given scrotal wounds that are draining, start vancomycin - Continue to monitor  Sepsis due to cellulitis (HCC) - Heart rate 125, respiratory rate 22, leukocytosis 32.8 - AKI and lactic acidosis - Suspected source is infected scrotal  wounds - Start vancomycin - CT pelvis - Patient was given 2.496 L bolus in the ED - Continue LR - Trend lactic acid - Blood culture pending - Continue to monitor  Dehydration - Secondary to DKA - With AKI and lactic acidosis - Continue fluids - Trend in the a.m.  Hyperlipidemia - Continue pravastatin  AKI (acute kidney injury) ([place]) - Creatinine increased from 0.9>> 1.41 - Likely due to hypovolemia in the setting of DKA - Continue IV fluids - Hold nephrotoxic agents when possible - Trend in the a.m.  Prolonged QT interval - QTc 673 - Likely due to acidosis - Continue treatment of DKA and dehydration with IV fluids - Monitor on telemetry - Continue to monitor  Scrotal ulcer - Open, draining scrotal wound - Wound care consult - Given large leukocytosis, and SIRS criteria, will start vancomycin - CT pelvis - Start vancomycin - Continue to monitor      Advance Care Planning:   Code Status: Full Code  Consults: Wound consult and TOC for medication assistance  Family Communication: No family at bedside  Severity of Illness: The appropriate patient status for this patient is  INPATIENT. Inpatient status is judged to be reasonable and necessary in order to provide the required intensity of service to ensure the patient's safety. The patient's presenting symptoms, physical exam findings, and initial radiographic and laboratory data in the context of their chronic comorbidities is felt to place them at high risk for further clinical deterioration. Furthermore, it is not anticipated that the patient will be medically stable for discharge from the hospital within 2 midnights of admission.   * I certify that at the point of admission it is my clinical judgment that the patient will require inpatient hospital care spanning beyond 2 midnights from the point of admission due to high intensity of service, high risk for further deterioration and high frequency of surveillance required.*  Author: Rolla Plate, DO 02/20/2023 3:16 AM  For on call review [email or web address].

## 2023-02-20 NOTE — Assessment & Plan Note (Addendum)
-   Creatinine increased from 0.9>> 1.41>>0.43 - Due to hypovolemia in the setting of DKA - reducing IV fluids as he is eating and drinking now

## 2023-02-20 NOTE — Assessment & Plan Note (Addendum)
-   QTc 673 on admission  - Likely due to acidosis - Continue treatment of DKA and dehydration with IV fluids - Monitored on telemetry for 24 hours

## 2023-02-20 NOTE — Consult Note (Signed)
Urology Consult  Referring physician: Dr. Wynetta Emery Reason for referral: scrotal abscess  Chief Complaint: scrotal pain  History of Present Illness: Mr Ketelhut is a 52yo with a history of DMII and DKA admitted with DKA. He was noted on admission to have cellulitis on his scrotum. Scrotal US was obtained which showed a 2cm scrotal abscess. The patient noted progressive scrotal swelling and pain over the past 2 weeks. He noted intermittent drainage from his scrotum over the past day. No difficulty urinating.   Past Medical History:  Diagnosis Date   Diabetes mellitus without complication ([place])    Past Surgical History:  Procedure Laterality Date   ANKLE FRACTURE SURGERY      Medications: I have reviewed the patient's current medications. Allergies:  Allergies  Allergen Reactions   Morphine Itching    Side effect Side effect     History reviewed. No pertinent family history. Social History:  reports that he has quit smoking. His smoking use included cigarettes. He has been exposed to tobacco smoke. He has quit using smokeless tobacco. He reports current alcohol use. He reports that he does not use drugs.  Review of Systems  Genitourinary:  Positive for scrotal swelling and testicular pain.  All other systems reviewed and are negative.   Physical Exam:  Vital signs in last 24 hours: Temp:  [97.7 F (36.5 C)-98.7 F (37.1 C)] 97.7 F (36.5 C) (03/28 1121) Pulse Rate:  [88-125] 91 (03/28 1100) Resp:  [12-27] 12 (03/28 1100) BP: (106-145)/(78-94) 137/85 (03/28 1100) SpO2:  [96 %-100 %] 100 % (03/28 1100) Weight:  [67.8 kg-74.8 kg] 67.8 kg (03/28 0404) Physical Exam Vitals reviewed.  Constitutional:      Appearance: Normal appearance.  HENT:     Head: Normocephalic and atraumatic.     Mouth/Throat:     Mouth: Mucous membranes are moist.  Eyes:     Extraocular Movements: Extraocular movements intact.     Pupils: Pupils are equal, round, and reactive to light.   Cardiovascular:     Rate and Rhythm: Normal rate and regular rhythm.  Pulmonary:     Effort: Pulmonary effort is normal. No respiratory distress.  Abdominal:     General: Abdomen is flat. There is no distension.  Genitourinary:    Penis: Normal and circumcised.      Testes: Normal.     Comments: Midline anterior scrotal wall abscess Musculoskeletal:        General: No swelling. Normal range of motion.     Cervical back: Normal range of motion. No rigidity.  Skin:    General: Skin is warm and dry.  Neurological:     General: No focal deficit present.     Mental Status: He is alert and oriented to person, place, and time.  Psychiatric:        Mood and Affect: Mood normal.        Behavior: Behavior normal.        Thought Content: Thought content normal.        Judgment: Judgment normal.     Laboratory Data:  Results for orders placed or performed during the hospital encounter of 02/19/23 (from the past 72 hour(s))  Lipase, blood     Status: Abnormal   Collection Time: 02/19/23 11:03 PM  Result Value Ref Range   Lipase 53 (H) 11 - 51 U/L    Comment: Performed at [facility], [address], [place],  [address]  Comprehensive metabolic panel     Status: Abnormal  Collection Time: 02/19/23 11:03 PM  Result Value Ref Range   Sodium 130 (L) 135 - 145 mmol/L    Comment: ELECTROLYTES REPEATED TO VERIFY   Potassium 3.8 3.5 - 5.1 mmol/L   Chloride 90 (L) 98 - 111 mmol/L   CO2 9 (L) 22 - 32 mmol/L   Glucose, Bld 453 (H) 70 - 99 mg/dL    Comment: Glucose reference range applies only to samples taken after fasting for at least 8 hours.   BUN 17 6 - 20 mg/dL   Creatinine, Ser 1.41 (H) 0.61 - 1.24 mg/dL   Calcium 8.7 (L) 8.9 - 10.3 mg/dL   Total Protein 7.6 6.5 - 8.1 g/dL   Albumin 3.9 3.5 - 5.0 g/dL   AST 14 (L) 15 - 41 U/L   ALT 13 0 - 44 U/L   Alkaline Phosphatase 154 (H) 38 - 126 U/L   Total Bilirubin 2.6 (H) 0.3 - 1.2 mg/dL   GFR, Estimated 60 (L) >60 mL/min     Comment: (NOTE) Calculated using the CKD-EPI Creatinine Equation (2021)    Anion gap 31 (H) 5 - 15    Comment: Performed at [facility], [address], [place], [place] [address]  CBC     Status: Abnormal   Collection Time: 02/19/23 11:03 PM  Result Value Ref Range   WBC 32.8 (H) 4.0 - 10.5 K/uL   RBC 6.27 (H) 4.22 - 5.81 MIL/uL   Hemoglobin 19.3 (H) 13.0 - 17.0 g/dL   HCT 56.6 (H) 39.0 - 52.0 %   MCV 90.3 80.0 - 100.0 fL   MCH 30.8 26.0 - 34.0 pg   MCHC 34.1 30.0 - 36.0 g/dL   RDW 13.0 11.5 - 15.5 %   Platelets 530 (H) 150 - 400 K/uL   nRBC 0.0 0.0 - 0.2 %    Comment: Performed at [facility], [address], [place], [place] [address]  Troponin I (High Sensitivity)     Status: None   Collection Time: 02/19/23 11:11 PM  Result Value Ref Range   Troponin I (High Sensitivity) 4 <18 ng/L    Comment: (NOTE) Elevated high sensitivity troponin I (hsTnI) values and significant  changes across serial measurements may suggest ACS but many other  chronic and acute conditions are known to elevate hsTnI results.  Refer to the "Links" section for chest pain algorithms and additional  guidance. Performed at [facility], [address] [place], [place] [address]   Beta-hydroxybutyric acid     Status: Abnormal   Collection Time: 02/19/23 11:11 PM  Result Value Ref Range   Beta-Hydroxybutyric Acid >8.00 (H) 0.05 - 0.27 mmol/L    Comment: RESULT CONFIRMED BY MANUAL DILUTION Performed at [facility], [address], [place], [place] [address]   Lactic acid, plasma     Status: Abnormal   Collection Time: 02/19/23 11:12 PM  Result Value Ref Range   Lactic Acid, Venous 2.9 (HH) 0.5 - 1.9 mmol/L    Comment: CRITICAL RESULT CALLED TO, READ BACK BY AND VERIFIED WITH G. PRUITT AT 0006 02/20/2023 BY T KENNEDY Performed at [facility], [address] [place], [place] [address]   Blood gas, venous (at [facility])     Status: Abnormal   Collection Time: 02/19/23 11:30 PM  Result Value Ref Range    pH, Ven 6.99 (LL) 7.25 - 7.43    Comment: CRITICAL RESULT CALLED TO, READ BACK BY AND VERIFIED WITH: G. PRUITT AT 2348 02/19/2023 BY T KENNEDY  pCO2, Ven 24 (L) 44 - 60 mmHg   pO2, Ven 39 32 - 45 mmHg   Bicarbonate 5.8 (L) 20.0 - 28.0 mmol/L   Acid-base deficit 24.5 (H) 0.0 - 2.0 mmol/L   O2 Saturation 69.1 %   Patient temperature 36.7    Collection site RIGHT ANTECUBITAL    Drawn by [phone]     Comment: Performed at [facility], [address], [place], [place] [address]  POC CBG, ED     Status: Abnormal   Collection Time: 02/20/23 12:02 AM  Result Value Ref Range   Glucose-Capillary 451 (H) 70 - 99 mg/dL    Comment: Glucose reference range applies only to samples taken after fasting for at least 8 hours.  CBG monitoring, ED     Status: Abnormal   Collection Time: 02/20/23 12:49 AM  Result Value Ref Range   Glucose-Capillary 472 (H) 70 - 99 mg/dL    Comment: Glucose reference range applies only to samples taken after fasting for at least 8 hours.  Basic metabolic panel     Status: Abnormal   Collection Time: 02/20/23 12:57 AM  Result Value Ref Range   Sodium 128 (L) 135 - 145 mmol/L    Comment: ELECTROLYTES REPEATED TO VERIFY   Potassium 3.1 (L) 3.5 - 5.1 mmol/L   Chloride 92 (L) 98 - 111 mmol/L   CO2 8 (L) 22 - 32 mmol/L   Glucose, Bld 415 (H) 70 - 99 mg/dL    Comment: Glucose reference range applies only to samples taken after fasting for at least 8 hours.   BUN 18 6 - 20 mg/dL   Creatinine, Ser 1.33 (H) 0.61 - 1.24 mg/dL   Calcium 8.3 (L) 8.9 - 10.3 mg/dL   GFR, Estimated >60 >60 mL/min    Comment: (NOTE) Calculated using the CKD-EPI Creatinine Equation (2021)    Anion gap 28 (H) 5 - 15    Comment: Performed at [facility], [address], [place], [place] [address]  Troponin I (High Sensitivity)     Status: None   Collection Time: 02/20/23 12:57 AM  Result Value Ref Range   Troponin I (High Sensitivity) 5 <18 ng/L    Comment: (NOTE) Elevated high sensitivity  troponin I (hsTnI) values and significant  changes across serial measurements may suggest ACS but many other  chronic and acute conditions are known to elevate hsTnI results.  Refer to the "Links" section for chest pain algorithms and additional  guidance. Performed at [facility], [address] [place], [place] [address]   HIV Antibody (routine testing w rflx)     Status: None   Collection Time: 02/20/23 12:57 AM  Result Value Ref Range   HIV Screen 4th Generation wRfx Non Reactive Non Reactive    Comment: Performed at [facility], [place] [address], [place], [place] [address]  CBG monitoring, ED     Status: Abnormal   Collection Time: 02/20/23  1:23 AM  Result Value Ref Range   Glucose-Capillary 405 (H) 70 - 99 mg/dL    Comment: Glucose reference range applies only to samples taken after fasting for at least 8 hours.  CBG monitoring, ED     Status: Abnormal   Collection Time: 02/20/23  1:55 AM  Result Value Ref Range   Glucose-Capillary 357 (H) 70 - 99 mg/dL    Comment: Glucose reference range applies only to samples taken after fasting for at least 8 hours.  Urinalysis, Routine w reflex microscopic -Urine, Clean  Catch     Status: Abnormal   Collection Time: 02/20/23  2:10 AM  Result Value Ref Range   Color, Urine YELLOW YELLOW   APPearance CLEAR CLEAR   Specific Gravity, Urine 1.027 1.005 - 1.030   pH 5.0 5.0 - 8.0   Glucose, UA >=500 (A) NEGATIVE mg/dL   Hgb urine dipstick MODERATE (A) NEGATIVE   Bilirubin Urine NEGATIVE NEGATIVE   Ketones, ur 80 (A) NEGATIVE mg/dL   Protein, ur 100 (A) NEGATIVE mg/dL   Nitrite NEGATIVE NEGATIVE   Leukocytes,Ua NEGATIVE NEGATIVE   RBC / HPF 0-5 0 - 5 RBC/hpf   WBC, UA 0-5 0 - 5 WBC/hpf   Bacteria, UA RARE (A) NONE SEEN   Squamous Epithelial / HPF 0-5 0 - 5 /HPF   Mucus PRESENT    Granular Casts, UA PRESENT     Comment: Performed at [facility], [address] [place], [place] [address]  MRSA Next Gen by PCR, Nasal     Status:  None   Collection Time: 02/20/23  2:28 AM   Specimen: Nasal Mucosa; Nasal Swab  Result Value Ref Range   MRSA by PCR Next Gen NOT DETECTED NOT DETECTED    Comment: (NOTE) The GeneXpert MRSA Assay (FDA approved for NASAL specimens only), is one component of a comprehensive MRSA colonization surveillance program. It is not intended to diagnose MRSA infection nor to guide or monitor treatment for MRSA infections. Test performance is not FDA approved in patients less than 11 years old. Performed at [facility], [address], [place], [place] [address]   Glucose, capillary     Status: Abnormal   Collection Time: 02/20/23  2:32 AM  Result Value Ref Range   Glucose-Capillary 298 (H) 70 - 99 mg/dL    Comment: Glucose reference range applies only to samples taken after fasting for at least 8 hours.  Glucose, capillary     Status: Abnormal   Collection Time: 02/20/23  3:54 AM  Result Value Ref Range   Glucose-Capillary 257 (H) 70 - 99 mg/dL    Comment: Glucose reference range applies only to samples taken after fasting for at least 8 hours.  Culture, blood (Routine X 2) w Reflex to ID Panel     Status: None (Preliminary result)   Collection Time: 02/20/23  4:35 AM   Specimen: BLOOD  Result Value Ref Range   Specimen Description BLOOD BLOOD RIGHT ARM    Special Requests      BOTTLES DRAWN AEROBIC AND ANAEROBIC Blood Culture adequate volume   Culture      NO GROWTH < 12 HOURS Performed at [facility], [address] [place],  [address]    Report Status PENDING   Basic metabolic panel     Status: Abnormal   Collection Time: 02/20/23  4:36 AM  Result Value Ref Range   Sodium 133 (L) 135 - 145 mmol/L   Potassium 2.6 (LL) 3.5 - 5.1 mmol/L    Comment: CRITICAL RESULT CALLED TO, READ BACK BY AND VERIFIED WITH ELLER,J AT 5:30AM ON 02/20/23 BY FESTERMAN,C   Chloride 100 98 - 111 mmol/L   CO2 13 (L) 22 - 32 mmol/L   Glucose, Bld 224 (H) 70 - 99 mg/dL    Comment: Glucose reference  range applies only to samples taken after fasting for at least 8 hours.   BUN 15 6 - 20 mg/dL   Creatinine, Ser 0.92 0.61 - 1.24 mg/dL   Calcium 8.8 (L) 8.9 - 10.3  mg/dL   GFR, Estimated >60 >60 mL/min    Comment: (NOTE) Calculated using the CKD-EPI Creatinine Equation (2021)    Anion gap 20 (H) 5 - 15    Comment: Performed at [facility], [address], [place], [place] [address]  Magnesium     Status: None   Collection Time: 02/20/23  4:36 AM  Result Value Ref Range   Magnesium 1.8 1.7 - 2.4 mg/dL    Comment: Performed at [facility], [address] [place], [place] [address]  Phosphorus     Status: Abnormal   Collection Time: 02/20/23  4:36 AM  Result Value Ref Range   Phosphorus 1.7 (L) 2.5 - 4.6 mg/dL    Comment: Performed at [facility], [address], [place], [place] [address]  CBC with Differential/Platelet     Status: Abnormal   Collection Time: 02/20/23  4:36 AM  Result Value Ref Range   WBC 27.3 (H) 4.0 - 10.5 K/uL   RBC 5.65 4.22 - 5.81 MIL/uL   Hemoglobin 17.2 (H) 13.0 - 17.0 g/dL   HCT 47.4 39.0 - 52.0 %   MCV 83.9 80.0 - 100.0 fL    Comment: DELTA CHECK NOTED   MCH 30.4 26.0 - 34.0 pg   MCHC 36.3 (H) 30.0 - 36.0 g/dL   RDW 12.6 11.5 - 15.5 %   Platelets 356 150 - 400 K/uL   nRBC 0.0 0.0 - 0.2 %   Neutrophils Relative % 88 %   Neutro Abs 24.3 (H) 1.7 - 7.7 K/uL   Lymphocytes Relative 4 %   Lymphs Abs 1.1 0.7 - 4.0 K/uL   Monocytes Relative 5 %   Monocytes Absolute 1.3 (H) 0.1 - 1.0 K/uL   Eosinophils Relative 0 %   Eosinophils Absolute 0.0 0.0 - 0.5 K/uL   Basophils Relative 1 %   Basophils Absolute 0.1 0.0 - 0.1 K/uL   Immature Granulocytes 2 %   Abs Immature Granulocytes 0.50 (H) 0.00 - 0.07 K/uL    Comment: Performed at [facility], [address], [place], [place] [address]  Lactic acid, plasma     Status: None   Collection Time: 02/20/23  4:36 AM  Result Value Ref Range   Lactic Acid, Venous 1.5 0.5 - 1.9 mmol/L    Comment: Performed at [facility], [address], [place], [place] [address]  Culture, blood (Routine X 2) w Reflex to ID Panel     Status: None (Preliminary result)   Collection Time: 02/20/23  4:36 AM   Specimen: BLOOD  Result Value Ref Range   Specimen Description BLOOD BLOOD RIGHT HAND    Special Requests      BOTTLES DRAWN AEROBIC AND ANAEROBIC Blood Culture adequate volume   Culture      NO GROWTH < 12 HOURS Performed at [facility], [address] [place], [place] [address]    Report Status PENDING   Glucose, capillary     Status: Abnormal   Collection Time: 02/20/23  4:59 AM  Result Value Ref Range   Glucose-Capillary 211 (H) 70 - 99 mg/dL    Comment: Glucose reference range applies only to samples taken after fasting for at least 8 hours.  Glucose, capillary     Status: Abnormal   Collection Time: 02/20/23  6:03 AM  Result Value Ref Range   Glucose-Capillary 218 (H) 70 - 99 mg/dL    Comment: Glucose reference range applies only to samples taken after fasting for at least 8 hours.  Glucose, capillary     Status: Abnormal   Collection Time: 02/20/23  6:50 AM  Result Value Ref Range   Glucose-Capillary 213 (H) 70 - 99 mg/dL    Comment: Glucose reference range applies only to samples taken after fasting for at least 8 hours.  Glucose, capillary     Status: Abnormal   Collection Time: 02/20/23  7:58 AM  Result Value Ref Range   Glucose-Capillary 243 (H) 70 - 99 mg/dL    Comment: Glucose reference range applies only to samples taken after fasting for at least 8 hours.  Basic metabolic panel     Status: Abnormal   Collection Time: 02/20/23  8:50 AM  Result Value Ref Range   Sodium 134 (L) 135 - 145 mmol/L   Potassium 2.9 (L) 3.5 - 5.1 mmol/L   Chloride 102 98 - 111 mmol/L   CO2 18 (L) 22 - 32 mmol/L   Glucose, Bld 218 (H) 70 - 99 mg/dL    Comment: Glucose reference range applies only to samples taken after fasting for at least 8 hours.   BUN 14 6 - 20 mg/dL   Creatinine, Ser 0.78 0.61 - 1.24 mg/dL    Calcium 8.8 (L) 8.9 - 10.3 mg/dL   GFR, Estimated >60 >60 mL/min    Comment: (NOTE) Calculated using the CKD-EPI Creatinine Equation (2021)    Anion gap 14 5 - 15    Comment: Performed at [facility], [address] [place], [place] [address]  Glucose, capillary     Status: Abnormal   Collection Time: 02/20/23  9:01 AM  Result Value Ref Range   Glucose-Capillary 195 (H) 70 - 99 mg/dL    Comment: Glucose reference range applies only to samples taken after fasting for at least 8 hours.  Glucose, capillary     Status: Abnormal   Collection Time: 02/20/23 10:04 AM  Result Value Ref Range   Glucose-Capillary 192 (H) 70 - 99 mg/dL    Comment: Glucose reference range applies only to samples taken after fasting for at least 8 hours.  Glucose, capillary     Status: Abnormal   Collection Time: 02/20/23 11:03 AM  Result Value Ref Range   Glucose-Capillary 210 (H) 70 - 99 mg/dL    Comment: Glucose reference range applies only to samples taken after fasting for at least 8 hours.  Glucose, capillary     Status: Abnormal   Collection Time: 02/20/23 12:01 PM  Result Value Ref Range   Glucose-Capillary 225 (H) 70 - 99 mg/dL    Comment: Glucose reference range applies only to samples taken after fasting for at least 8 hours.  Basic metabolic panel     Status: Abnormal   Collection Time: 02/20/23 12:55 PM  Result Value Ref Range   Sodium 129 (L) 135 - 145 mmol/L   Potassium 3.0 (L) 3.5 - 5.1 mmol/L   Chloride 99 98 - 111 mmol/L   CO2 20 (L) 22 - 32 mmol/L   Glucose, Bld 189 (H) 70 - 99 mg/dL    Comment: Glucose reference range applies only to samples taken after fasting for at least 8 hours.   BUN 11 6 - 20 mg/dL   Creatinine, Ser 0.58 (L) 0.61 - 1.24 mg/dL   Calcium 8.2 (L) 8.9 - 10.3 mg/dL   GFR, Estimated >60 >60 mL/min    Comment: (NOTE) Calculated using the CKD-EPI Creatinine Equation (2021)    Anion gap 10 5 - 15    Comment: Performed at  [facility]  [place], [place] [address]  Glucose, capillary     Status: Abnormal   Collection Time: 02/20/23  1:01 PM  Result Value Ref Range   Glucose-Capillary 213 (H) 70 - 99 mg/dL    Comment: Glucose reference range applies only to samples taken after fasting for at least 8 hours.   Recent Results (from the past 240 hour(s))  MRSA Next Gen by PCR, Nasal     Status: None   Collection Time: 02/20/23  2:28 AM   Specimen: Nasal Mucosa; Nasal Swab  Result Value Ref Range Status   MRSA by PCR Next Gen NOT DETECTED NOT DETECTED Final    Comment: (NOTE) The GeneXpert MRSA Assay (FDA approved for NASAL specimens only), is one component of a comprehensive MRSA colonization surveillance program. It is not intended to diagnose MRSA infection nor to guide or monitor treatment for MRSA infections. Test performance is not FDA approved in patients less than 104 years old. Performed at [facility], [address] [place], [place] [address]   Culture, blood (Routine X 2) w Reflex to ID Panel     Status: None (Preliminary result)   Collection Time: 02/20/23  4:35 AM   Specimen: BLOOD  Result Value Ref Range Status   Specimen Description BLOOD BLOOD RIGHT ARM  Final   Special Requests   Final    BOTTLES DRAWN AEROBIC AND ANAEROBIC Blood Culture adequate volume   Culture   Final    NO GROWTH < 12 HOURS Performed at [facility], [address] [place], [place] [address]    Report Status PENDING  Incomplete  Culture, blood (Routine X 2) w Reflex to ID Panel     Status: None (Preliminary result)   Collection Time: 02/20/23  4:36 AM   Specimen: BLOOD  Result Value Ref Range Status   Specimen Description BLOOD BLOOD RIGHT HAND  Final   Special Requests   Final    BOTTLES DRAWN AEROBIC AND ANAEROBIC Blood Culture adequate volume   Culture   Final    NO GROWTH < 12 HOURS Performed at [facility], [address] [place], [place] [address]    Report Status PENDING  Incomplete   Creatinine: Recent Labs     02/19/23 2303 02/20/23 0057 02/20/23 0436 02/20/23 0850 02/20/23 1255  CREATININE 1.41* 1.33* 0.92 0.78 0.58*   Baseline Creatinine: 0.9   Scrotal abscess procedure note:  Pre-procedure diagnosis: scrotal abscess  Post-procedure diagnosis: Same  Procedure: 1. Incision and drainage of scrotal abscess  Attending: Nicolette Bang, MD  Anesthesia: local  History of blood loss: Minimal  Specimens: 1. Wound culture  Findings: 2cm midline anterior scrotal wall abscess  Indications: Patient is a 52 year old male with a scrotal abscess that was growing in size.  We discussed the treatment options including observation versus incision and drainage after discussing treatment options he proceed with incision and drainage  Procedure in detail:  His genitalia was then prepped and draped in usual sterile fashion. 1% lidocaine was injected around the abscess.  A 2 cm incision was made over the abscess. The abscess was drained and wound culture was obtained. Saline soaked gauze was used to pack the wound.  A dressing was then applied to the incision.  We then placed a scrotal fluff and this then concluded the procedure which was well tolerated by the patient.  Complications: None   Impression/Assessment:  52yo with scrotal abscess  Plan:  Scrotal abscess drained at the bedside  and wound culture sent. Please continue broad spectrum antibiotics pending wound culture. Please continue daily dressing change  Nicolette Bang 02/20/2023, 1:54 PM

## 2023-02-20 NOTE — Progress Notes (Signed)
Pharmacy Antibiotic Note  Bradley Miller is a 52 y.o. male admitted on 02/19/2023 with  wound infection .  Pharmacy has been consulted for vancomycin dosing.  Plan: Vancomycin 1500mg  x1 then 750mg  IV Q12H. Goal AUC 400-550.  Expected AUC 450.  Height: 5\' 11"  (180.3 cm) Weight: 74.8 kg (165 lb) IBW/kg (Calculated) : 75.3  Temp (24hrs), Avg:98 F (36.7 C), Min:97.8 F (36.6 C), Max:98.1 F (36.7 C)  Recent Labs  Lab 02/19/23 2303 02/19/23 2312 02/20/23 0057  WBC 32.8*  --   --   CREATININE 1.41*  --  1.33*  LATICACIDVEN  --  2.9*  --     Estimated Creatinine Clearance: 68.7 mL/min (A) (by C-G formula based on SCr of 1.33 mg/dL (H)).    Allergies  Allergen Reactions   Morphine Itching    Side effect Side effect     Thank you for allowing pharmacy to be a part of this patient's care.  Wynona Neat, PharmD, BCPS  02/20/2023 3:05 AM

## 2023-02-20 NOTE — Assessment & Plan Note (Addendum)
-   Secondary to DKA, severe  hypovolemia - With AKI and lactic acidosis - AKI resolved, reduce IV fluids now that he is eating/drinking

## 2023-02-20 NOTE — Hospital Course (Signed)
52 y.o. male with medical history significant of diabetes mellitus type 2, hyperlipidemia, presents the ED with a chief complaints of nausea and vomiting.  Patient reports that he has had dehydration, nausea, vomiting, somnolence, feeling of being off balance, that started 4 days ago.  He reports it was gradual in onset.  He has had polyuria and polydipsia.  He reports his oxygen sats have been low at home, but he has not felt short of breath.  He does not wear oxygen at home.  He is requiring oxygen in the ER.  Patient reports that he has been fighting indigestion for a while.  He is not on any medications for it.  He is not on any medications at all at home, because he has had financial barriers to obtaining his medications.  Patient reports he has had nausea/vomiting up to 9 times per day.  There is no hematemesis.  He reports associated burning indigestion pain.  The pain is at the top of his stomach and in his esophagus.  The pain is intermittent.  His last normal meal was 2 days ago.  His last p.o. ingestion was chicken noodles soup yesterday.  He has not had any diarrhea.  Patient reports he has not been checking his sugars at home because he does not have any test strips.  He has not been taking insulin at least 2 months.  He reports associated weight loss of approximately 25 pounds.  He has no other complaints at this time.   Patient is a longtime smoker.  He reports he quit 1 week ago.  He says that he does not need nicotine patch.  He does not drink alcohol, does not use illicit drugs.  He is not vaccinated for COVID or flu.  Patient is full code.

## 2023-02-20 NOTE — Assessment & Plan Note (Addendum)
-   secondary to uncontrolled diabetes mellitus A1c>14%  -- working on glycemic control, check lipids outpatient and treat as needed; he has been on pravastatin in the past

## 2023-02-20 NOTE — Progress Notes (Signed)
Initial Nutrition Assessment  DOCUMENTATION CODES:   Severe malnutrition in context of chronic illness  INTERVENTION:  RD will follow for diet advancement     NUTRITION DIAGNOSIS:   Severe Malnutrition related to chronic illness as evidenced by percent weight loss, severe muscle depletion, severe fat depletion.   GOAL:  Patient will meet greater than or equal to 90% of their needs   MONITOR:  Diet advancement, Labs, Weight trends  REASON FOR ASSESSMENT:   Malnutrition Screening Tool    ASSESSMENT: Patient is a 52 yo male from home with severe DKA. Patient reports being diabetic since 2009. 02/19/23 Beta-hydroxybutyric acid >8.00. Patient has not been checking blood glucose due to lack of supplies.   Patient had vomiting episode this morning per nursing. NPO at this time.   Patient lives with his wife who is the cook for the household. Patient reports 2 main meal daily breakfast and dinner. He likes soups for snack. Able to feed himself. He declines education handouts.  Weight loss noted compared to last year March- down 27 kg (59 lb) which is 28% significant for time frame. Current weight  67.8 kg (149.47 lb).   Medications reviewed and include:  IV antibiotics  IVF-D5% in lactated ringers with KCL 20 mEq @ 150 ml/hr.   Insulin continuous-6.5 units/hour  Potassium phosphate 30 mmol  CBG (last 3) 01/05/22- A1C-14.8% Recent Labs    02/20/23 1201 02/20/23 1301 02/20/23 1407  GLUCAP 225* 213* 167*       Latest Ref Rng & Units 02/20/2023   12:55 PM 02/20/2023    8:50 AM 02/20/2023    4:36 AM  BMP  Glucose 70 - 99 mg/dL 189  218  224   BUN 6 - 20 mg/dL 11  14  15    Creatinine 0.61 - 1.24 mg/dL 0.58  0.78  0.92   Sodium 135 - 145 mmol/L 129  134  133   Potassium 3.5 - 5.1 mmol/L 3.0  2.9  2.6   Chloride 98 - 111 mmol/L 99  102  100   CO2 22 - 32 mmol/L 20  18  13    Calcium 8.9 - 10.3 mg/dL 8.2  8.8  8.8       NUTRITION - FOCUSED PHYSICAL EXAM:  Flowsheet Row  Most Recent Value  Orbital Region Severe depletion  Upper Arm Region Severe depletion  Thoracic and Lumbar Region Severe depletion  Buccal Region Moderate depletion  Temple Region Severe depletion  Clavicle Bone Region Severe depletion  Clavicle and Acromion Bone Region Severe depletion  Scapular Bone Region Severe depletion  Dorsal Hand Moderate depletion  Patellar Region Severe depletion  Anterior Thigh Region Severe depletion  Posterior Calf Region Moderate depletion  Edema (RD Assessment) None  Hair Reviewed  [balding]  Eyes Reviewed  Mouth Reviewed  Skin Reviewed  Nails Reviewed       Diet Order:   Diet Order             Diet NPO time specified Except for: Sips with Meds, Ice Chips  Diet effective now                   EDUCATION NEEDS: none identified   Skin:  Skin Assessment: Skin Integrity Issues: Skin Integrity Issues::  (non-pressure scrotal wound)  Last BM:  PTA  Height:   Ht Readings from Last 1 Encounters:  02/20/23 6\' 1"  (1.854 m)    Weight:   Wt Readings from Last 1 Encounters:  02/20/23 67.8  kg    Ideal Body Weight:   84 kg  BMI:  Body mass index is 19.72 kg/m.  Estimated Nutritional Needs:   Kcal:  2200-2400  Protein:  98-110 gr  Fluid:  >/=2 liters daily   Colman Cater MS,RD,CSG,LDN Contact: Amion

## 2023-02-20 NOTE — Assessment & Plan Note (Addendum)
-   Admission HR 125, respiratory rate 22, leukocytosis 32.8 - AKI and lactic acidosis and leukocytosis resolved - Suspected source is infected scrotal wounds - vancomycin started, add Zosyn for anaerobic and pseudomonas coverage  - CT pelvis and scrotal US confirmed right scrotal abscess, treated with I&D 3/28  - Blood culture pending--NGTD  - Sepsis physiology RESOLVED

## 2023-02-20 NOTE — Plan of Care (Signed)

## 2023-02-20 NOTE — Assessment & Plan Note (Addendum)
--   as evidenced by a previous A1c>14% -- current A1c 14.6% -- pt has not been able to afford his medication -- TOC consulted for assistance with medication and outpatient follow up -- added metformin ER 500 mg with titration up to 2 tabs po BID after a couple of weeks  -- will discharge home on novolin 70/30 kwikpen take 15 units BID with meals, can titrate doses outpatient as needed  -- Blood glucose meter kit with testing supplies prescribed -- hypoglycemia precautions and instructions to test BS at least 4-5 times per day

## 2023-02-20 NOTE — Assessment & Plan Note (Addendum)
-   wound culture sent from drainage - awaiting culture results  - I have consulted Dr. Jeffie Pollock with [facility] and he arranged for I&D on 3/28 with Dr. Alyson Ingles - added Zosyn to vancomycin for anaerobic and pseudomonas coverage : prelim: G pos rods, G pos cocci in pairs - CT pelvis and scrotal US suggesting abscess  - working on glycemic control as noted -- Pt not willing to wait in hospital for culture results, insists on discharging home today due to Easter holiday; We don't have final cultures back.  Pt agreeable to follow up with Dr. Alyson Ingles urologist -- discussed with pharm D, he was treated with 4 days of zosyn and vanc and now s/p I&D and wound draining well; prelim culture results suggesting staph species and will DC home on oral doxycycline 100 mg BID x 7 days.

## 2023-02-21 LAB — CBC WITH DIFFERENTIAL/PLATELET
Abs Immature Granulocytes: 0.05 10*3/uL (ref 0.00–0.07)
Basophils Absolute: 0 10*3/uL (ref 0.0–0.1)
Basophils Relative: 0 %
Eosinophils Absolute: 0.1 10*3/uL (ref 0.0–0.5)
Eosinophils Relative: 1 %
HCT: 38 % — ABNORMAL LOW (ref 39.0–52.0)
Hemoglobin: 13.6 g/dL (ref 13.0–17.0)
Immature Granulocytes: 1 %
Lymphocytes Relative: 14 %
Lymphs Abs: 1.4 10*3/uL (ref 0.7–4.0)
MCH: 30.4 pg (ref 26.0–34.0)
MCHC: 35.8 g/dL (ref 30.0–36.0)
MCV: 85 fL (ref 80.0–100.0)
Monocytes Absolute: 0.7 10*3/uL (ref 0.1–1.0)
Monocytes Relative: 7 %
Neutro Abs: 7.6 10*3/uL (ref 1.7–7.7)
Neutrophils Relative %: 77 %
Platelets: 206 10*3/uL (ref 150–400)
RBC: 4.47 MIL/uL (ref 4.22–5.81)
RDW: 12.4 % (ref 11.5–15.5)
WBC: 9.8 10*3/uL (ref 4.0–10.5)
nRBC: 0 % (ref 0.0–0.2)

## 2023-02-21 LAB — HEMOGLOBIN A1C
Hgb A1c MFr Bld: 14.6 % — ABNORMAL HIGH (ref 4.8–5.6)
Mean Plasma Glucose: 372 mg/dL

## 2023-02-21 LAB — GLUCOSE, CAPILLARY
Glucose-Capillary: 131 mg/dL — ABNORMAL HIGH (ref 70–99)
Glucose-Capillary: 132 mg/dL — ABNORMAL HIGH (ref 70–99)
Glucose-Capillary: 155 mg/dL — ABNORMAL HIGH (ref 70–99)
Glucose-Capillary: 157 mg/dL — ABNORMAL HIGH (ref 70–99)
Glucose-Capillary: 166 mg/dL — ABNORMAL HIGH (ref 70–99)
Glucose-Capillary: 233 mg/dL — ABNORMAL HIGH (ref 70–99)
Glucose-Capillary: 238 mg/dL — ABNORMAL HIGH (ref 70–99)
Glucose-Capillary: 250 mg/dL — ABNORMAL HIGH (ref 70–99)

## 2023-02-21 LAB — BASIC METABOLIC PANEL
Anion gap: 8 (ref 5–15)
BUN: 7 mg/dL (ref 6–20)
CO2: 22 mmol/L (ref 22–32)
Calcium: 8 mg/dL — ABNORMAL LOW (ref 8.9–10.3)
Chloride: 100 mmol/L (ref 98–111)
Creatinine, Ser: 0.43 mg/dL — ABNORMAL LOW (ref 0.61–1.24)
GFR, Estimated: >60 mL/min (ref >60)
Glucose, Bld: 142 mg/dL — ABNORMAL HIGH (ref 70–99)
Potassium: 3 mmol/L — ABNORMAL LOW (ref 3.5–5.1)
Sodium: 130 mmol/L — ABNORMAL LOW (ref 135–145)

## 2023-02-21 LAB — PHOSPHORUS: Phosphorus: 1.1 mg/dL — ABNORMAL LOW (ref 2.5–4.6)

## 2023-02-21 LAB — POTASSIUM: Potassium: 3 mmol/L — ABNORMAL LOW (ref 3.5–5.1)

## 2023-02-21 LAB — CREATININE, SERUM
Creatinine, Ser: 0.43 mg/dL — ABNORMAL LOW (ref 0.61–1.24)
GFR, Estimated: >60 mL/min (ref >60)

## 2023-02-21 LAB — MAGNESIUM: Magnesium: 1.6 mg/dL — ABNORMAL LOW (ref 1.7–2.4)

## 2023-02-21 MED ORDER — ALUM & MAG HYDROXIDE-SIMETH 200-200-20 MG/5ML PO SUSP
30.0000 mL | ORAL | Status: DC | PRN
  Administered 2023-02-21 (×3): 30 mL via ORAL
  Filled 2023-02-21 (×3): qty 30

## 2023-02-21 MED ORDER — PANTOPRAZOLE SODIUM 40 MG PO TBEC
40.0000 mg | DELAYED_RELEASE_TABLET | Freq: Every day | ORAL | Status: DC
  Administered 2023-02-21 – 2023-02-23 (×3): 40 mg via ORAL
  Filled 2023-02-21 (×3): qty 1

## 2023-02-21 MED ORDER — MAGNESIUM SULFATE 4 GM/100ML IV SOLN
4.0000 g | Freq: Once | INTRAVENOUS | Status: AC
  Administered 2023-02-21: 4 g via INTRAVENOUS
  Filled 2023-02-21: qty 100

## 2023-02-21 MED ORDER — POTASSIUM PHOSPHATES 15 MMOLE/5ML IV SOLN
30.0000 mmol | Freq: Once | INTRAVENOUS | Status: AC
  Administered 2023-02-21: 30 mmol via INTRAVENOUS
  Filled 2023-02-21: qty 10

## 2023-02-21 MED ORDER — METFORMIN HCL ER 500 MG PO TB24: 500.0000 mg | ORAL_TABLET | Freq: Every day | ORAL | Status: DC

## 2023-02-21 MED ORDER — INSULIN DETEMIR 100 UNIT/ML ~~LOC~~ SOLN
30.0000 [IU] | Freq: Every day | SUBCUTANEOUS | Status: DC
  Administered 2023-02-21: 30 [IU] via SUBCUTANEOUS
  Filled 2023-02-21 (×2): qty 0.3

## 2023-02-21 MED ORDER — INSULIN ASPART 100 UNIT/ML IJ SOLN
0.0000 [IU] | Freq: Every day | INTRAMUSCULAR | Status: DC
  Administered 2023-02-21: 2 [IU] via SUBCUTANEOUS

## 2023-02-21 MED ORDER — POTASSIUM CHLORIDE CRYS ER 20 MEQ PO TBCR
40.0000 meq | EXTENDED_RELEASE_TABLET | Freq: Three times a day (TID) | ORAL | Status: AC
  Administered 2023-02-21 (×3): 40 meq via ORAL
  Filled 2023-02-21 (×3): qty 2

## 2023-02-21 MED ORDER — METFORMIN HCL ER 500 MG PO TB24
500.0000 mg | ORAL_TABLET | Freq: Every day | ORAL | Status: DC
  Administered 2023-02-22: 500 mg via ORAL
  Filled 2023-02-21 (×2): qty 1

## 2023-02-21 MED ORDER — PANTOPRAZOLE SODIUM 40 MG PO TBEC: 40.0000 mg | DELAYED_RELEASE_TABLET | Freq: Every day | ORAL | Status: DC

## 2023-02-21 MED ORDER — INSULIN ASPART 100 UNIT/ML IJ SOLN
10.0000 [IU] | Freq: Three times a day (TID) | INTRAMUSCULAR | Status: DC
  Administered 2023-02-21 – 2023-02-23 (×6): 10 [IU] via SUBCUTANEOUS

## 2023-02-21 MED ORDER — INSULIN ASPART 100 UNIT/ML IJ SOLN
0.0000 [IU] | Freq: Three times a day (TID) | INTRAMUSCULAR | Status: DC
  Administered 2023-02-21: 3 [IU] via SUBCUTANEOUS
  Administered 2023-02-21: 2 [IU] via SUBCUTANEOUS
  Administered 2023-02-21 (×2): 5 [IU] via SUBCUTANEOUS
  Administered 2023-02-22: 2 [IU] via SUBCUTANEOUS
  Administered 2023-02-23: 3 [IU] via SUBCUTANEOUS

## 2023-02-21 MED ORDER — INSULIN DETEMIR 100 UNIT/ML ~~LOC~~ SOLN
30.0000 [IU] | Freq: Two times a day (BID) | SUBCUTANEOUS | Status: DC
  Administered 2023-02-21 – 2023-02-22 (×2): 30 [IU] via SUBCUTANEOUS
  Filled 2023-02-21 (×4): qty 0.3

## 2023-02-21 NOTE — Progress Notes (Signed)
PROGRESS NOTE   Bradley Miller  Bradley Miller DOB: [birth year] DOA: 02/19/2023 PCP: Wilburt Finlay, MD   Chief Complaint  Patient presents with   Emesis   Level of care: Med-Surg  Brief Admission History:  52 y.o. male with medical history significant of diabetes mellitus type 2, hyperlipidemia, presents the ED with a chief complaints of nausea and vomiting.  Patient reports that he has had dehydration, nausea, vomiting, somnolence, feeling of being off balance, that started 4 days ago.  He reports it was gradual in onset.  He has had polyuria and polydipsia.  He reports his oxygen sats have been low at home, but he has not felt short of breath.  He does not wear oxygen at home.  He is requiring oxygen in the ER.  Patient reports that he has been fighting indigestion for a while.  He is not on any medications for it.  He is not on any medications at all at home, because he has had financial barriers to obtaining his medications.  Patient reports he has had nausea/vomiting up to 9 times per day.  There is no hematemesis.  He reports associated burning indigestion pain.  The pain is at the top of his stomach and in his esophagus.  The pain is intermittent.  His last normal meal was 2 days ago.  His last p.o. ingestion was chicken noodles soup yesterday.  He has not had any diarrhea.  Patient reports he has not been checking his sugars at home because he does not have any test strips.  He has not been taking insulin at least 2 months.  He reports associated weight loss of approximately 25 pounds.  He has no other complaints at this time.   Patient is a longtime smoker.  He reports he quit 1 week ago.  He says that he does not need nicotine patch.  He does not drink alcohol, does not use illicit drugs.  He is not vaccinated for COVID or flu.  Patient is full code.   Assessment and Plan: * Severe DKA (diabetic ketoacidosis) - pH 6.99, bicarb 9, glucose 453, gap 31 - DKA treatment protocol started per  [facility] - Patient previously had taken 36 units of basal insulin at baseline, as well as glipizide, metformin, and Ozempic, but patient has not had medication in months due to losing job and insurance benefits - DKA physiology and metabolic acidosis resolved  - transitioned to levemir 30 units BID plus novolog 10 units TID with meals; consider adding metformin ER QHS if tolerates meals today;   Scrotal cellulitis and abscess - wound culture sent from drainage - awaiting culture results  - I have consulted Dr. Jeffie Pollock with [facility] and he arranged for I&D on 3/28 with Dr. Alyson Ingles - added Zosyn to vancomycin for anaerobic and pseudomonas coverage  - CT pelvis and scrotal US suggesting abscess  - working on glycemic control as noted   Protein-calorie malnutrition, severe ([place]) -- appreciate dietitian consultation and recommendations  DM (diabetes mellitus) type 2, uncontrolled, with ketoacidosis ([place]) -- as evidenced by a previous A1c>14% -- current A1c pending -- pt has not been able to afford his medication -- TOC consulted for assistance with medication and outpatient follow up  Sepsis due to cellulitis ([place]) - Admission HR 125, respiratory rate 22, leukocytosis 32.8 - AKI and lactic acidosis and leukocytosis resolved - Suspected source is infected scrotal wounds - vancomycin started, add Zosyn for anaerobic and pseudomonas coverage  - CT pelvis  and scrotal US confirmed right scrotal abscess, treated with I&D 3/28  - Blood culture pending--NGTD  - Continue to monitor  Dehydration - Secondary to DKA, severe  hypovolemia - With AKI and lactic acidosis - AKI resolved, reduce IV fluids now that he is eating/drinking  Hyperlipidemia - secondary to uncontrolled diabetes mellitus -- Continue pravastatin and working on glycemic control   AKI (acute kidney injury) ([place]) - Creatinine increased from 0.9>> 1.41>>0.43 - Due to hypovolemia in the setting of DKA - reducing IV  fluids as he is eating and drinking now  Prolonged QT interval - QTc 673 - Likely due to acidosis - Continue treatment of DKA and dehydration with IV fluids - Monitor on telemetry - Continue to monitor  DVT prophylaxis: [place] heparin  Code Status: Full  Family Communication:  Disposition: Status is: Inpatient Remains inpatient appropriate because: IV fluids, IV insulin, IV antibiotics required    Consultants:  Urology Dr. Enis Gash   Procedures:  Bedside I&D right scrotum abscess 02/20/23  Antimicrobials:  Zosyn 3/28>> Vancomycin 3/28>>   Subjective: Pt tolerating diet well; tolerated I&D well;   Objective: Vitals:   02/21/23 0513 02/21/23 0700 02/21/23 0800 02/21/23 0900  BP:  114/71 113/73 (!) 166/69  Pulse:  92 97 88  Resp:  13 16 15   Temp:   98.6 F (37 C)   TempSrc:   Oral   SpO2:  97% 98% 97%  Weight: 75.6 kg     Height:        Intake/Output Summary (Last 24 hours) at 02/21/2023 1030 Last data filed at 02/21/2023 [id number] Gross per 24 hour  Intake 4855.69 ml  Output 2150 ml  Net 2705.69 ml   Filed Weights   02/19/23 2302 02/20/23 0404 02/21/23 0513  Weight: 74.8 kg 67.8 kg 75.6 kg   Examination:  General exam: cachectic appearing, chronically ill appearing male, Appears calm and comfortable  Respiratory system: Clear to auscultation. Respiratory effort normal. Cardiovascular system: normal S1 & S2 heard. No JVD, murmurs, rubs, gallops or clicks. No pedal edema. Gastrointestinal system: Abdomen is nondistended, soft and nontender. No organomegaly or masses felt. Normal bowel sounds heard. 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  Central nervous system: Alert and oriented. No focal neurological deficits. Extremities: Symmetric 5 x 5 power. Skin: No rashes, lesions or ulcers. Psychiatry: Judgement and insight appear normal. Mood & affect  appropriate.   Data Reviewed: I have personally reviewed following labs and imaging studies  CBC: Recent Labs  Lab 02/19/23 2303 02/20/23 0436 02/21/23 0422  WBC 32.8* 27.3* 9.8  NEUTROABS  --  24.3* 7.6  HGB 19.3* 17.2* 13.6  HCT 56.6* 47.4 38.0*  MCV 90.3 83.9 85.0  PLT 530* 356 [id number]    Basic Metabolic Panel: Recent Labs  Lab 02/20/23 0436 02/20/23 0850 02/20/23 1255 02/20/23 1625 02/21/23 0422 02/21/23 0552  NA 133* 134* 129* 129*  --  130*  K 2.6* 2.9* 3.0* 3.1* 3.0* 3.0*  CL 100 102 99 100  --  100  CO2 13* 18* 20* 21*  --  22  GLUCOSE 224* 218* 189* 161*  --  142*  BUN 15 14 11 10   --  7  CREATININE 0.92 0.78 0.58* 0.48* 0.43* 0.43*  CALCIUM 8.8* 8.8* 8.2* 8.1*  --  8.0*  MG 1.8  --   --   --  1.6*  --   PHOS 1.7*  --   --   --  1.1*  --     CBG: Recent Labs  Lab 02/21/23 0001 02/21/23 0116 02/21/23 0344 02/21/23 0425 02/21/23 0750  GLUCAP 157* 166* 132* 155* 250*    Recent Results (from the past 240 hour(s))  MRSA Next Gen by PCR, Nasal     Status: None   Collection Time: 02/20/23  2:28 AM   Specimen: Nasal Mucosa; Nasal Swab  Result Value Ref Range Status   MRSA by PCR Next Gen NOT DETECTED NOT DETECTED Final    Comment: (NOTE) The GeneXpert MRSA Assay (FDA approved for NASAL specimens only), is one component of a comprehensive MRSA colonization surveillance program. It is not intended to diagnose MRSA infection nor to guide or monitor treatment for MRSA infections. Test performance is not FDA approved in patients less than 32 years old. Performed at [facility], [address] [place], [place] [address]   Culture, blood (Routine X 2) w Reflex to ID Panel     Status: None (Preliminary result)   Collection Time: 02/20/23  4:35 AM   Specimen: BLOOD  Result Value Ref Range Status   Specimen Description BLOOD BLOOD RIGHT ARM  Final   Special Requests   Final    BOTTLES DRAWN AEROBIC AND ANAEROBIC Blood Culture adequate volume   Culture    Final    NO GROWTH < 12 HOURS Performed at [facility], [address], [place], [place] [address]    Report Status PENDING  Incomplete  Culture, blood (Routine X 2) w Reflex to ID Panel     Status: None (Preliminary result)   Collection Time: 02/20/23  4:36 AM   Specimen: BLOOD  Result Value Ref Range Status   Specimen Description BLOOD BLOOD RIGHT HAND  Final   Special Requests   Final    BOTTLES DRAWN AEROBIC AND ANAEROBIC Blood Culture adequate volume   Culture   Final    NO GROWTH < 12 HOURS Performed at [facility], [address] [place], [place] [address]    Report Status PENDING  Incomplete  Aerobic Culture w Gram Stain (superficial specimen)     Status: None (Preliminary result)   Collection Time: 02/20/23  8:17 AM   Specimen: Scrotum  Result Value Ref Range Status   Specimen Description   Final    SCROTUM Performed at [facility], [address], [place], [place] [address]    Special Requests   Final    Immunocompromised Performed at [facility], [address] [place], [place] [address]    Gram Stain   Final    RARE WBC PRESENT,BOTH PMN AND MONONUCLEAR RARE GRAM POSITIVE RODS    Culture   Final    CULTURE REINCUBATED FOR BETTER GROWTH Performed at [facility], [place] [address] [place], [place] [address]    Report Status PENDING  Incomplete     Radiology Studies: US SCROTUM  Result Date: 02/20/2023 CLINICAL DATA:  Two-week history of left scrotal abscess with 1 day history of drainage EXAM: ULTRASOUND OF SCROTUM TECHNIQUE: Complete ultrasound examination of the testicles, epididymis, and other scrotal structures was performed. COMPARISON:  CT pelvis dated 02/12/2023 FINDINGS: Right testicle Measurements: 4.0 x 2.3 x 2.3 cm, 10.9 mL. No mass or microlithiasis visualized. Left testicle Measurements: 4.5 x 2.4 x 2.1 cm, 11.6 mL. No mass or microlithiasis visualized. Asymmetric, heterogeneously hypoechoic soft tissue thickening overlying the left testicle  associated with asymmetric hyperemia. Focal, heterogeneously hypoechoic area within the scrotal soft tissue overlying the epididymal head measures  2.5 x 2.1 cm. Right epididymis:  Normal in size and appearance. Left epididymis:  Normal in size and appearance. Hydrocele:  None visualized. Varicocele:  None visualized. IMPRESSION: 1. Asymmetric, heterogeneously hypoechoic soft tissue thickening overlying the left testicle associated with asymmetric hyperemia, likely cellulitis. 2. Focal, heterogeneously hypoechoic area within the scrotal soft tissue overlying the left epididymal head measures 2.5 x 2.1 cm, likely reflecting phlegmon/developing abscess. 3. Normal sonographic appearance of the testicles. Electronically Signed   By: Darrin Nipper M.D.   On: 02/20/2023 11:00   CT PELVIS WO CONTRAST  Result Date: 02/20/2023 CLINICAL DATA:  Soft tissue infection suspected, wounds on scrotum from ingrown hairs. EXAM: CT PELVIS WITHOUT CONTRAST TECHNIQUE: Multidetector CT imaging of the pelvis was performed following the standard protocol without intravenous contrast. RADIATION DOSE REDUCTION: This exam was performed according to the departmental dose-optimization program which includes automated exposure control, adjustment of the mA and/or kV according to patient size and/or use of iterative reconstruction technique. COMPARISON:  [id number]. FINDINGS: Urinary Tract:  No abnormality visualized. Bowel:  Unremarkable visualized pelvic bowel loops. Vascular/Lymphatic: Prominent lymph nodes are present in the inguinal region on the left, likely reactive. No significant vascular abnormality seen. Reproductive:  The prostate gland is within normal limits. Other: Increased density is noted in the scrotal wall. There are hypodense regions in the anterior aspect of the scrotum measuring 1.9 cm and 1.0 cm. There is skin thickening along the medial proximal left thigh with presumed packing material in this region, limiting evaluation. No  subcutaneous emphysema is seen. Musculoskeletal: Degenerative changes are present in the lower lumbar spine. No acute osseous abnormality. IMPRESSION: Increased density in the scrotal wall with hypodense regions in the anterior scrotum measuring 1.9 and 1.0 cm, possible phlegmon or developing abscesses. No subcutaneous emphysema. Ultrasound is recommended for further evaluation. Electronically Signed   By: Brett Fairy M.D.   On: 02/20/2023 04:16   CT Angio Chest/Abd/Pel for Dissection W and/or Wo Contrast  Result Date: 02/20/2023 CLINICAL DATA:  Nausea and vomiting with chest pain for several hours, initial encounter EXAM: CT ANGIOGRAPHY CHEST, ABDOMEN AND PELVIS TECHNIQUE: Non-contrast CT of the chest was initially obtained. Multidetector CT imaging through the chest, abdomen and pelvis was performed using the standard protocol during bolus administration of intravenous contrast. Multiplanar reconstructed images and MIPs were obtained and reviewed to evaluate the vascular anatomy. RADIATION DOSE REDUCTION: This exam was performed according to the departmental dose-optimization program which includes automated exposure control, adjustment of the mA and/or kV according to patient size and/or use of iterative reconstruction technique. CONTRAST:  179mL OMNIPAQUE IOHEXOL 350 MG/ML SOLN COMPARISON:  Chest x-ray from the previous day. FINDINGS: CTA CHEST FINDINGS Cardiovascular: Initial precontrast images show no hyperdense crescent to suggest acute aortic injury. Atherosclerotic calcifications are noted. Post-contrast images demonstrate no evidence of aneurysmal dilatation or dissection. Heart is not significantly enlarged. No large central pulmonary embolus is noted although not timed for embolus evaluation. Mediastinum/Nodes: Thoracic inlet is within normal limits. No hilar or mediastinal adenopathy is noted. The esophagus as visualized is within normal limits. Lungs/Pleura: Lungs are well aerated bilaterally. No  focal infiltrate or sizable effusion is seen. No sizable parenchymal nodules are noted. Small calcified granuloma is noted in the left lower lobe. Musculoskeletal: No acute rib abnormality is noted. Mild degenerative changes of the thoracic spine are seen. Review of the MIP images confirms the above findings. CTA ABDOMEN AND PELVIS FINDINGS VASCULAR Aorta: Normal caliber aorta without aneurysm, dissection, vasculitis or significant  stenosis. Celiac: Patent without evidence of aneurysm, dissection, vasculitis or significant stenosis. SMA: Patent without evidence of aneurysm, dissection, vasculitis or significant stenosis. Renals: Both renal arteries are patent without evidence of aneurysm, dissection, vasculitis, fibromuscular dysplasia or significant stenosis. IMA: Patent without evidence of aneurysm, dissection, vasculitis or significant stenosis. Inflow: Iliacs are within normal limits. Veins: No specific venous abnormality is noted. Review of the MIP images confirms the above findings. NON-VASCULAR Hepatobiliary: No focal liver abnormality is seen. No gallstones, gallbladder wall thickening, or biliary dilatation. Pancreas: Unremarkable. No pancreatic ductal dilatation or surrounding inflammatory changes. Spleen: Normal in size without focal abnormality. Adrenals/Urinary Tract: Adrenal glands are within normal limits. Kidneys are well visualized bilaterally. No renal calculi or obstructive changes are seen. The bladder is well distended. Stomach/Bowel: No obstructive or inflammatory changes of the colon are seen. The appendix is well visualized and within normal limits. Small bowel and stomach are unremarkable. Lymphatic: No lymphadenopathy is noted. Reproductive: Prostate is unremarkable. Other: No abdominal wall hernia or abnormality. No abdominopelvic ascites. Musculoskeletal: No acute or significant osseous findings. Review of the MIP images confirms the above findings. IMPRESSION: No evidence of acute aortic  dissection or aneurysmal dilatation. No evidence of pulmonary emboli. Changes of prior granulomatous disease. No other focal abnormality is noted. Electronically Signed   By: Inez Catalina M.D.   On: 02/20/2023 00:40   DG Chest Port 1 View  Result Date: 02/19/2023 CLINICAL DATA:  Shortness of breath EXAM: PORTABLE CHEST 1 VIEW COMPARISON:  None Available. FINDINGS: No acute airspace disease. Normal cardiac size. No pneumothorax. Probable emphysema. IMPRESSION: No active disease. Electronically Signed   By: Donavan Foil M.D.   On: 02/19/2023 23:26    Scheduled Meds:  Chlorhexidine Gluconate Cloth  6 each Topical Daily   gabapentin  100 mg Oral TID   Gerhardt's butt cream   Topical TID   heparin  5,000 Units Subcutaneous Q8H   insulin aspart  0-15 Units Subcutaneous TID WC   insulin aspart  0-5 Units Subcutaneous QHS   insulin aspart  10 Units Subcutaneous TID WC   insulin detemir  30 Units Subcutaneous BID   pantoprazole  40 mg Oral Daily   potassium chloride  40 mEq Oral TID   pravastatin  40 mg Oral Daily   Continuous Infusions:  magnesium sulfate bolus IVPB 4 g (02/21/23 0847)   piperacillin-tazobactam (ZOSYN)  IV 3.375 g (02/21/23 0508)   potassium PHOSPHATE IVPB (in mmol)     promethazine (PHENERGAN) injection (IM or IVPB)     vancomycin 1,000 mg (02/21/23 0512)     LOS: 1 day   Critical Care Procedure Note Authorized and Performed by: Murvin Natal MD  Total Critical Care time:  45 mins  Due to a high probability of clinically significant, life threatening deterioration, the patient required my highest level of preparedness to intervene emergently and I personally spent this critical care time directly and personally managing the patient.  This critical care time included obtaining a history; examining the patient, pulse oximetry; ordering and review of studies; arranging urgent treatment with development of a management plan; evaluation of patient's response of treatment; frequent  reassessment; and discussions with other providers.  This critical care time was performed to assess and manage the high probability of imminent and life threatening deterioration that could result in multi-organ failure.  It was exclusive of separately billable procedures and treating other patients and teaching time.    Irwin Brakeman, MD How to contact the [facility]  Attending or Consulting provider [place] or covering provider during after hours [place], for this patient?  Check the care team in [facility] and look for a) attending/consulting TRH provider listed and b) the [facility] team listed Log into [email or web address] and use [place]'s universal password to access. If you do not have the password, please contact the hospital operator. Locate the [facility] provider you are looking for under [facility] and page to a number that you can be directly reached. If you still have difficulty reaching the provider, please page the [facility] (Director on Call) for the Hospitalists listed on amion for assistance.  02/21/2023, 10:30 AM

## 2023-02-21 NOTE — TOC Progression Note (Signed)
Transition of Care [facility]) - Progression Note    Patient Details  Name: Bradley Miller MRN: [id number] Date of Birth: 1971/04/18  Transition of Care [facility]) CM/SW Contact  Boneta Lucks, RN Phone Number: 02/21/2023, 3:00 PM  Clinical Narrative:   Patient enrolled and accepted for a MATCH Voucher.  Start date 02/22/23 - 03/02/23  ID [id number].  Coupon printed to give to patient at discharge. TOC following.   Expected Discharge Plan: Home/Self Care Barriers to Discharge: Continued Medical Work up  Expected Discharge Plan and Services         Social Determinants of Health (SDOH) Interventions SDOH Screenings   Food Insecurity: No Food Insecurity (02/20/2023)  Housing: Low Risk  (02/20/2023)  Transportation Needs: No Transportation Needs (02/20/2023)  Utilities: At Risk (02/20/2023)  Tobacco Use: Medium Risk (02/19/2023)    Readmission Risk Interventions     No data to display

## 2023-02-21 NOTE — Assessment & Plan Note (Signed)
--   appreciate dietitian consultation and recommendations

## 2023-02-21 NOTE — Plan of Care (Signed)
  Problem: Education: Goal: Knowledge of General Education information will improve Description Including pain rating scale, medication(s)/side effects and non-pharmacologic comfort measures Outcome: Progressing   

## 2023-02-22 DIAGNOSIS — E43 Unspecified severe protein-calorie malnutrition: Secondary | ICD-10-CM

## 2023-02-22 LAB — BASIC METABOLIC PANEL
Anion gap: 5 (ref 5–15)
BUN: 5 mg/dL — ABNORMAL LOW (ref 6–20)
CO2: 28 mmol/L (ref 22–32)
Calcium: 8.5 mg/dL — ABNORMAL LOW (ref 8.9–10.3)
Chloride: 99 mmol/L (ref 98–111)
Creatinine, Ser: 0.51 mg/dL — ABNORMAL LOW (ref 0.61–1.24)
GFR, Estimated: >60 mL/min (ref >60)
Glucose, Bld: 123 mg/dL — ABNORMAL HIGH (ref 70–99)
Potassium: 3.9 mmol/L (ref 3.5–5.1)
Sodium: 132 mmol/L — ABNORMAL LOW (ref 135–145)

## 2023-02-22 LAB — GLUCOSE, CAPILLARY
Glucose-Capillary: 115 mg/dL — ABNORMAL HIGH (ref 70–99)
Glucose-Capillary: 88 mg/dL (ref 70–99)
Glucose-Capillary: 111 mg/dL — ABNORMAL HIGH (ref 70–99)
Glucose-Capillary: 146 mg/dL — ABNORMAL HIGH (ref 70–99)
Glucose-Capillary: 157 mg/dL — ABNORMAL HIGH (ref 70–99)

## 2023-02-22 LAB — CBC WITH DIFFERENTIAL/PLATELET
Abs Immature Granulocytes: 0.04 10*3/uL (ref 0.00–0.07)
Basophils Absolute: 0 10*3/uL (ref 0.0–0.1)
Basophils Relative: 1 %
Eosinophils Absolute: 0.1 10*3/uL (ref 0.0–0.5)
Eosinophils Relative: 1 %
HCT: 38 % — ABNORMAL LOW (ref 39.0–52.0)
Hemoglobin: 13.4 g/dL (ref 13.0–17.0)
Immature Granulocytes: 1 %
Lymphocytes Relative: 26 %
Lymphs Abs: 1.9 10*3/uL (ref 0.7–4.0)
MCH: 30.7 pg (ref 26.0–34.0)
MCHC: 35.3 g/dL (ref 30.0–36.0)
MCV: 87 fL (ref 80.0–100.0)
Monocytes Absolute: 0.5 10*3/uL (ref 0.1–1.0)
Monocytes Relative: 6 %
Neutro Abs: 4.9 10*3/uL (ref 1.7–7.7)
Neutrophils Relative %: 65 %
Platelets: 175 10*3/uL (ref 150–400)
RBC: 4.37 MIL/uL (ref 4.22–5.81)
RDW: 12.2 % (ref 11.5–15.5)
WBC: 7.4 10*3/uL (ref 4.0–10.5)
nRBC: 0 % (ref 0.0–0.2)

## 2023-02-22 LAB — PHOSPHORUS: Phosphorus: 2.3 mg/dL — ABNORMAL LOW (ref 2.5–4.6)

## 2023-02-22 LAB — MAGNESIUM: Magnesium: 1.9 mg/dL (ref 1.7–2.4)

## 2023-02-22 MED ORDER — POLYETHYLENE GLYCOL 3350 17 G PO PACK
17.0000 g | PACK | Freq: Every day | ORAL | Status: DC
  Administered 2023-02-22 – 2023-02-23 (×2): 17 g via ORAL
  Filled 2023-02-22 (×2): qty 1

## 2023-02-22 MED ORDER — INSULIN DETEMIR 100 UNIT/ML ~~LOC~~ SOLN
25.0000 [IU] | Freq: Two times a day (BID) | SUBCUTANEOUS | Status: DC
  Administered 2023-02-22: 25 [IU] via SUBCUTANEOUS
  Filled 2023-02-22 (×4): qty 0.25

## 2023-02-22 NOTE — Progress Notes (Signed)
PROGRESS NOTE   Bradley Miller  [id number] DOB: December 12, 1970 DOA: 02/19/2023 PCP: Wilburt Finlay, MD   Chief Complaint  Patient presents with   Emesis   Level of care: Med-Surg  Brief Admission History:  52 y.o. male with medical history significant of diabetes mellitus type 2, hyperlipidemia, presents the ED with a chief complaints of nausea and vomiting.  Patient reports that he has had dehydration, nausea, vomiting, somnolence, feeling of being off balance, that started 4 days ago.  He reports it was gradual in onset.  He has had polyuria and polydipsia.  He reports his oxygen sats have been low at home, but he has not felt short of breath.  He does not wear oxygen at home.  He is requiring oxygen in the ER.  Patient reports that he has been fighting indigestion for a while.  He is not on any medications for it.  He is not on any medications at all at home, because he has had financial barriers to obtaining his medications.  Patient reports he has had nausea/vomiting up to 9 times per day.  There is no hematemesis.  He reports associated burning indigestion pain.  The pain is at the top of his stomach and in his esophagus.  The pain is intermittent.  His last normal meal was 2 days ago.  His last p.o. ingestion was chicken noodles soup yesterday.  He has not had any diarrhea.  Patient reports he has not been checking his sugars at home because he does not have any test strips.  He has not been taking insulin at least 2 months.  He reports associated weight loss of approximately 25 pounds.  He has no other complaints at this time.   Patient is a longtime smoker.  He reports he quit 1 week ago.  He says that he does not need nicotine patch.  He does not drink alcohol, does not use illicit drugs.  He is not vaccinated for COVID or flu.  Patient is full code.   Assessment and Plan: * Severe DKA (diabetic ketoacidosis) -- TREATED AND RESOLVED NOW - presented with pH 6.99, bicarb 9, glucose 453,  gap 31 - DKA treatment protocol started per [facility] - Patient previously had taken 36 units of basal insulin at baseline, as well as glipizide, metformin, and Ozempic, but patient has not had medication in months due to losing job and insurance benefits - DKA physiology and metabolic acidosis resolved  - treated with levemir 25 units BID plus novolog 10 units TID with meals; added metformin ER 500 mg with supper   -- unfortunately due to having no insurance, we will discharge him on a plan that is less costly to patient out of pocket. Discussed with Education officer, museum.  DC on novolin (relion) 70/30 flexpen 15 units BID with meals, metformin ER 500 mg - titrate up to 1000 mg BID; titrate insulin outpatient for better glycemic control; close monitoring of CBG strongly advised and RX for testing supplies provided;   Scrotal cellulitis and abscess - wound culture sent from drainage - awaiting culture results  - I have consulted Dr. Jeffie Pollock with [facility] and he arranged for I&D on 3/28 with Dr. Alyson Ingles - added Zosyn to vancomycin for anaerobic and pseudomonas coverage : prelim: G pos rods, G pos cocci in pairs - CT pelvis and scrotal US suggesting abscess  - working on glycemic control as noted -- Pt not willing to wait in hospital for culture results, insists on  discharging home today due to Easter holiday; We don't have final cultures back.  Pt agreeable to follow up with Dr. Alyson Ingles urologist -- discussed with pharm D, he was treated with 4 days of zosyn and vanc and now s/p I&D and wound draining well; prelim culture results suggesting staph species and will DC home on oral doxycycline 100 mg BID x 7 days.    Protein-calorie malnutrition, severe ([place]) -- appreciate dietitian consultation and recommendations  DM (diabetes mellitus) type 2, uncontrolled, with ketoacidosis ([place]) -- as evidenced by a previous A1c>14% -- current A1c 14.6% -- pt has not been able to afford his medication -- TOC  consulted for assistance with medication and outpatient follow up -- added metformin ER 500 mg with titration up to 2 tabs po BID after a couple of weeks  -- will discharge home on novolin 70/30 kwikpen take 15 units BID with meals, can titrate doses outpatient as needed  -- Blood glucose meter kit with testing supplies prescribed -- hypoglycemia precautions and instructions to test BS at least 4-5 times per day  Sepsis due to cellulitis ([place]) - Admission HR 125, respiratory rate 22, leukocytosis 32.8 - AKI and lactic acidosis and leukocytosis resolved - Suspected source is infected scrotal wounds - vancomycin started, add Zosyn for anaerobic and pseudomonas coverage  - CT pelvis and scrotal US confirmed right scrotal abscess, treated with I&D 3/28  - Blood culture pending--NGTD  - Sepsis physiology RESOLVED   Dehydration - Secondary to DKA, severe  hypovolemia - With AKI and lactic acidosis - AKI resolved, RESOLVED   Hyperlipidemia - secondary to uncontrolled diabetes mellitus A1c>14%  -- working on glycemic control, check lipids outpatient and treat as needed; he has been on pravastatin in the past  AKI (acute kidney injury) ([place]) - Creatinine increased from 0.9>> 1.41>>0.43 - RESOLVED - Due to hypovolemia in the setting of DKA - DC IV fluids as he is eating and drinking well  Prolonged QT interval - QTc 673 on admission  - Likely due to acidosis - Continue treatment of DKA and dehydration with IV fluids - Monitored on telemetry for 24 hours   DVT prophylaxis: [place] heparin  Code Status: Full  Family Communication:  Disposition: Status is: Inpatient Remains inpatient appropriate because: IV fluids, IV insulin, IV antibiotics required    Consultants:  Urology Dr. Enis Gash   Procedures:  Bedside I&D right scrotum abscess 02/20/23  Antimicrobials:  Zosyn 3/28>> Vancomycin 3/28>>   Subjective: Pt reports painful left testis.    Objective: Vitals:   02/22/23  0404 02/22/23 1623 02/22/23 2100 02/23/23 0253  BP: 112/74 103/74 112/71 96/72  Pulse: 81 80 80 70  Resp: 18  18 16   Temp: 98.5 F (36.9 C) 98 F (36.7 C) 99.1 F (37.3 C) 98.3 F (36.8 C)  TempSrc: Oral Oral Oral Oral  SpO2: 100% 99% 100% 100%  Weight:      Height:        Intake/Output Summary (Last 24 hours) at 02/23/2023 1017 Last data filed at 02/23/2023 0900 Gross per 24 hour  Intake 893.19 ml  Output 2900 ml  Net -2006.81 ml   Filed Weights   02/19/23 2302 02/20/23 0404 02/21/23 0513  Weight: 74.8 kg 67.8 kg 75.6 kg   Examination:  General exam: cachectic appearing, chronically ill appearing male, Appears calm and comfortable  Respiratory system: Clear to auscultation. Respiratory effort normal. Cardiovascular system: normal S1 & S2 heard. No JVD, murmurs, rubs, gallops or clicks. No pedal edema.  Gastrointestinal system: Abdomen is nondistended, soft and nontender. No organomegaly or masses felt. Normal bowel sounds heard. 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  Central nervous system: Alert and oriented. No focal neurological deficits. Extremities: Symmetric 5 x 5 power. Skin: No rashes, lesions or ulcers. Psychiatry: Judgement and insight appear normal. Mood & affect appropriate.   Data Reviewed: I have personally reviewed following labs and imaging studies  CBC: Recent Labs  Lab 02/19/23 2303 02/20/23 0436 02/21/23 0422 02/22/23 0613  WBC 32.8* 27.3* 9.8 7.4  NEUTROABS  --  24.3* 7.6 4.9  HGB 19.3* 17.2* 13.6 13.4  HCT 56.6* 47.4 38.0* 38.0*  MCV 90.3 83.9 85.0 87.0  PLT 530* 356 206 0000000    Basic Metabolic Panel: Recent Labs  Lab 02/20/23 0436 02/20/23 0850 02/20/23 1255 02/20/23 1625 02/21/23 0422 02/21/23 0552 02/22/23 0613  NA 133* 134* 129* 129*  --  130* 132*   K 2.6* 2.9* 3.0* 3.1* 3.0* 3.0* 3.9  CL 100 102 99 100  --  100 99  CO2 13* 18* 20* 21*  --  22 28  GLUCOSE 224* 218* 189* 161*  --  142* 123*  BUN 15 14 11 10   --  7 5*  CREATININE 0.92 0.78 0.58* 0.48* 0.43* 0.43* 0.51*  CALCIUM 8.8* 8.8* 8.2* 8.1*  --  8.0* 8.5*  MG 1.8  --   --   --  1.6*  --  1.9  PHOS 1.7*  --   --   --  1.1*  --  2.3*    CBG: Recent Labs  Lab 02/22/23 1133 02/22/23 1624 02/22/23 2117 02/23/23 0256 02/23/23 0723  GLUCAP 111* 146* 157* 128* 180*    Recent Results (from the past 240 hour(s))  MRSA Next Gen by PCR, Nasal     Status: None   Collection Time: 02/20/23  2:28 AM   Specimen: Nasal Mucosa; Nasal Swab  Result Value Ref Range Status   MRSA by PCR Next Gen NOT DETECTED NOT DETECTED Final    Comment: (NOTE) The GeneXpert MRSA Assay (FDA approved for NASAL specimens only), is one component of a comprehensive MRSA colonization surveillance program. It is not intended to diagnose MRSA infection nor to guide or monitor treatment for MRSA infections. Test performance is not FDA approved in patients less than 43 years old. Performed at [facility], [address] [place], [place] [address]   Culture, blood (Routine X 2) w Reflex to ID Panel     Status: None (Preliminary result)   Collection Time: 02/20/23  4:35 AM   Specimen: BLOOD  Result Value Ref Range Status   Specimen Description BLOOD BLOOD RIGHT ARM  Final   Special Requests   Final    BOTTLES DRAWN AEROBIC AND ANAEROBIC Blood Culture adequate volume   Culture   Final    NO GROWTH 3 DAYS Performed at [facility], [address] [place], [place] [address]    Report Status PENDING  Incomplete  Culture, blood (Routine X 2) w Reflex to ID Panel     Status: None (Preliminary result)   Collection Time: 02/20/23  4:36 AM   Specimen: BLOOD  Result Value Ref Range Status   Specimen Description BLOOD BLOOD RIGHT HAND  Final   Special Requests   Final    BOTTLES DRAWN AEROBIC AND ANAEROBIC  Blood  Culture adequate volume   Culture   Final    NO GROWTH 3 DAYS Performed at [facility], [address], [place], [place] [address]    Report Status PENDING  Incomplete  Aerobic Culture w Gram Stain (superficial specimen)     Status: None (Preliminary result)   Collection Time: 02/20/23  8:17 AM   Specimen: Scrotum  Result Value Ref Range Status   Specimen Description   Final    SCROTUM Performed at [facility], [address] [place], [place] [address]    Special Requests   Final    Immunocompromised Performed at [facility], [address] [place], [place] [address]    Gram Stain   Final    RARE WBC PRESENT,BOTH PMN AND MONONUCLEAR RARE GRAM POSITIVE RODS    Culture   Final    FEW STAPHYLOCOCCUS CAPITIS FEW STAPHYLOCOCCUS EPIDERMIDIS RARE STAPHYLOCOCCUS LUGDUNENSIS SUSCEPTIBILITIES TO FOLLOW Performed at [facility], [place] [address], [place], [place] [address]    Report Status PENDING  Incomplete  Anaerobic culture w Gram Stain     Status: None (Preliminary result)   Collection Time: 02/20/23  8:17 AM   Specimen: Scrotum  Result Value Ref Range Status   Specimen Description   Final    SCROTUM Performed at [facility], [address] [place], [place] [address]    Special Requests   Final    Immunocompromised Performed at [facility], [address], [place], [place] [address]    Gram Stain PENDING  Incomplete   Culture   Final    NO ANAEROBES ISOLATED; CULTURE IN PROGRESS FOR 5 DAYS   Report Status PENDING  Incomplete  Aerobic Culture w Gram Stain (superficial specimen)     Status: None (Preliminary result)   Collection Time: 02/20/23  1:24 PM   Specimen: Scrotum  Result Value Ref Range Status   Specimen Description   Final    SCROTUM Performed at [facility], [address] [place], [place] [address]    Special Requests   Final    NONE Performed at [facility], [address] [place], [place] [address]    Gram Stain   Final    NO WBC  SEEN MODERATE GRAM POSITIVE RODS RARE GRAM POSITIVE COCCI IN PAIRS    Culture   Final    CULTURE REINCUBATED FOR BETTER GROWTH MODERATE STAPHYLOCOCCUS LUGDUNENSIS SUBBING TO ISOLATE FOR WORKUP Performed at [facility], [place] [address], [place], [place] [address]    Report Status PENDING  Incomplete     Radiology Studies: No results found.  Scheduled Meds:  Chlorhexidine Gluconate Cloth  6 each Topical Daily   gabapentin  100 mg Oral TID   Gerhardt's butt cream   Topical TID   heparin  5,000 Units Subcutaneous Q8H   insulin aspart  0-15 Units Subcutaneous TID WC   insulin aspart  0-5 Units Subcutaneous QHS   insulin aspart protamine- aspart  15 Units Subcutaneous BID WC   metFORMIN  500 mg Oral Q supper   pantoprazole  40 mg Oral Daily   polyethylene glycol  17 g Oral Daily   pravastatin  40 mg Oral Daily   Continuous Infusions:  piperacillin-tazobactam (ZOSYN)  IV 3.375 g (02/23/23 0631)   promethazine (PHENERGAN) injection (IM or IVPB)     vancomycin 1,000 mg (02/23/23 0520)     LOS: 3 days   Time spent: 40 mins    Courtenay Hirth, MD How to contact the  [facility] Attending or Consulting provider [place] or covering provider during after hours [place], for this patient?  Check the care team in [facility] and look for a) attending/consulting [facility] provider listed and b) the [facility] team listed Log into [email or web address] and use [place]'s universal password to access. If you do not have the password, please contact the hospital operator. Locate the [facility] provider you are looking for under [facility] and page to a number that you can be directly reached. If you still have difficulty reaching the provider, please page the [facility] (Director on Call) for the Hospitalists listed on amion for assistance.  02/23/2023, 10:17 AM

## 2023-02-22 NOTE — Plan of Care (Signed)
  Problem: Activity: Goal: Risk for activity intolerance will decrease Outcome: Progressing   

## 2023-02-23 LAB — GLUCOSE, CAPILLARY
Glucose-Capillary: 128 mg/dL — ABNORMAL HIGH (ref 70–99)
Glucose-Capillary: 180 mg/dL — ABNORMAL HIGH (ref 70–99)

## 2023-02-23 MED ORDER — LANCETS MISC. MISC: 1.0000 | Freq: Three times a day (TID) | 0 refills | Status: DC

## 2023-02-23 MED ORDER — METFORMIN HCL ER 500 MG PO TB24: ORAL_TABLET | ORAL | 2 refills | Status: DC

## 2023-02-23 MED ORDER — BLOOD GLUCOSE TEST VI STRP: 1.0000 | ORAL_STRIP | Freq: Three times a day (TID) | 0 refills | Status: AC

## 2023-02-23 MED ORDER — LANCETS MISC. MISC: 1.0000 | Freq: Three times a day (TID) | 0 refills | Status: AC

## 2023-02-23 MED ORDER — OXYCODONE HCL 5 MG PO CAPS: 5.0000 mg | ORAL_CAPSULE | Freq: Three times a day (TID) | ORAL | 0 refills | Status: AC | PRN

## 2023-02-23 MED ORDER — LANCET DEVICE MISC: 1.0000 | Freq: Three times a day (TID) | 0 refills | Status: AC

## 2023-02-23 MED ORDER — GERHARDT'S BUTT CREAM: 1.0000 | TOPICAL_CREAM | Freq: Three times a day (TID) | CUTANEOUS | 0 refills | Status: AC

## 2023-02-23 MED ORDER — LANCET DEVICE MISC: 1.0000 | Freq: Three times a day (TID) | 0 refills | Status: DC

## 2023-02-23 MED ORDER — BD PEN NEEDLE NANO U/F 32G X 4 MM MISC: 2 refills | Status: AC

## 2023-02-23 MED ORDER — BLOOD GLUCOSE MONITORING SUPPL DEVI: 1.0000 | Freq: Three times a day (TID) | 0 refills | Status: DC

## 2023-02-23 MED ORDER — GERHARDT'S BUTT CREAM: 1.0000 | TOPICAL_CREAM | Freq: Three times a day (TID) | CUTANEOUS | 0 refills | Status: DC

## 2023-02-23 MED ORDER — INSULIN DETEMIR 100 UNIT/ML ~~LOC~~ SOLN
22.0000 [IU] | Freq: Two times a day (BID) | SUBCUTANEOUS | Status: DC
  Filled 2023-02-23 (×3): qty 0.22

## 2023-02-23 MED ORDER — BLOOD GLUCOSE MONITORING SUPPL DEVI: 1.0000 | Freq: Three times a day (TID) | 0 refills | Status: AC

## 2023-02-23 MED ORDER — BLOOD GLUCOSE TEST VI STRP: 1.0000 | ORAL_STRIP | Freq: Three times a day (TID) | 0 refills | Status: DC

## 2023-02-23 MED ORDER — DOXYCYCLINE HYCLATE 100 MG PO CAPS: 100.0000 mg | ORAL_CAPSULE | Freq: Two times a day (BID) | ORAL | 0 refills | Status: DC

## 2023-02-23 MED ORDER — NOVOLIN 70/30 FLEXPEN (70-30) 100 UNIT/ML ~~LOC~~ SUPN: 15.0000 [IU] | PEN_INJECTOR | Freq: Two times a day (BID) | SUBCUTANEOUS | 2 refills | Status: DC

## 2023-02-23 MED ORDER — DOXYCYCLINE HYCLATE 100 MG PO CAPS: 100.0000 mg | ORAL_CAPSULE | Freq: Two times a day (BID) | ORAL | 0 refills | Status: AC

## 2023-02-23 MED ORDER — INSULIN ASPART PROT & ASPART (70-30 MIX) 100 UNIT/ML ~~LOC~~ SUSP
15.0000 [IU] | Freq: Two times a day (BID) | SUBCUTANEOUS | Status: DC
  Filled 2023-02-23: qty 10

## 2023-02-23 MED ORDER — SODIUM CHLORIDE 0.9 % IV SOLN: 6.2500 mg | Freq: Four times a day (QID) | INTRAVENOUS | Status: DC | PRN

## 2023-02-23 NOTE — Progress Notes (Signed)
Patient given discharge packet with follow up appointments and medication instructions. Verbalized understanding of new orders . IV removed from LFA . Transported out via w/c to personal vehicle without incident,

## 2023-02-23 NOTE — Discharge Instructions (Signed)
IMPORTANT INFORMATION: PAY CLOSE ATTENTION  ° °PHYSICIAN DISCHARGE INSTRUCTIONS ° °Follow with Primary care provider  Kotturi, Vinay K, MD  and other consultants as instructed by your Hospitalist Physician ° °SEEK MEDICAL CARE OR RETURN TO EMERGENCY ROOM IF SYMPTOMS COME BACK, WORSEN OR NEW PROBLEM DEVELOPS  ° °Please note: °You were cared for by a hospitalist during your hospital stay. Every effort will be made to forward records to your primary care provider.  You can request that your primary care provider send for your hospital records if they have not received them.  Once you are discharged, your primary care physician will handle any further medical issues. Please note that NO REFILLS for any discharge medications will be authorized once you are discharged, as it is imperative that you return to your primary care physician (or establish a relationship with a primary care physician if you do not have one) for your post hospital discharge needs so that they can reassess your need for medications and monitor your lab values. ° °Please get a complete blood count and chemistry panel checked by your Primary MD at your next visit, and again as instructed by your Primary MD. ° °Get Medicines reviewed and adjusted: °Please take all your medications with you for your next visit with your Primary MD ° °Laboratory/radiological data: °Please request your Primary MD to go over all hospital tests and procedure/radiological results at the follow up, please ask your primary care provider to get all Hospital records sent to his/her office. ° °In some cases, they will be blood work, cultures and biopsy results pending at the time of your discharge. Please request that your primary care provider follow up on these results. ° °If you are diabetic, please bring your blood sugar readings with you to your follow up appointment with primary care.   ° °Please call and make your follow up appointments as soon as possible.   ° °Also Note  the following: °If you experience worsening of your admission symptoms, develop shortness of breath, life threatening emergency, suicidal or homicidal thoughts you must seek medical attention immediately by calling 911 or calling your MD immediately  if symptoms less severe. ° °You must read complete instructions/literature along with all the possible adverse reactions/side effects for all the Medicines you take and that have been prescribed to you. Take any new Medicines after you have completely understood and accpet all the possible adverse reactions/side effects.  ° °Do not drive when taking Pain medications or sleeping medications (Benzodiazepines) ° °Do not take more than prescribed Pain, Sleep and Anxiety Medications. It is not advisable to combine anxiety,sleep and pain medications without talking with your primary care practitioner ° °Special Instructions: If you have smoked or chewed Tobacco  in the last 2 yrs please stop smoking, stop any regular Alcohol  and or any Recreational drug use. ° °Wear Seat belts while driving.  Do not drive if taking any narcotic, mind altering or controlled substances or recreational drugs or alcohol.  ° ° ° ° ° °

## 2023-02-23 NOTE — Discharge Summary (Addendum)
Physician Discharge Summary  ANABEL HOLLERBACH [id number] DOB: Apr 11, 1971 DOA: 02/19/2023  PCP: Wilburt Finlay, MD  Admit date: 02/19/2023 Discharge date: 02/23/2023  Admitted From:  Home  Disposition:  Home   Pt insists on discharging home today, not willing to wait for final culture data  Recommendations for Outpatient Follow-up:  Follow up with PCP in 1 weeks Follow up with Dr. Alyson Ingles - Urology office  in 1 week  Please continue to titrate diabetes meds for better glycemic control  Please follow up on the following pending results: Final abscess cultures and sensitivity  Discharge Condition: STABLE   CODE STATUS: FULL DIET: carb modified;    Brief Hospitalization Summary: Please see all hospital notes, images, labs for full details of the hospitalization. ADMISSION HPI: 52 y.o. male with medical history significant of diabetes mellitus type 2, hyperlipidemia, presents the ED with a chief complaints of nausea and vomiting.  Patient reports that he has had dehydration, nausea, vomiting, somnolence, feeling of being off balance, that started 4 days ago.  He reports it was gradual in onset.  He has had polyuria and polydipsia.  He reports his oxygen sats have been low at home, but he has not felt short of breath.  He does not wear oxygen at home.  He is requiring oxygen in the ER.  Patient reports that he has been fighting indigestion for a while.  He is not on any medications for it.  He is not on any medications at all at home, because he has had financial barriers to obtaining his medications.  Patient reports he has had nausea/vomiting up to 9 times per day.  There is no hematemesis.  He reports associated burning indigestion pain.  The pain is at the top of his stomach and in his esophagus.  The pain is intermittent.  His last normal meal was 2 days ago.  His last p.o. ingestion was chicken noodles soup yesterday.  He has not had any diarrhea.  Patient reports he has not been  checking his sugars at home because he does not have any test strips.  He has not been taking insulin at least 2 months.  He reports associated weight loss of approximately 25 pounds.  He has no other complaints at this time.   Patient is a longtime smoker.  He reports he quit 1 week ago.  He says that he does not need nicotine patch.  He does not drink alcohol, does not use illicit drugs.  He is not vaccinated for COVID or flu.  Patient is full code.  HOSPITAL COURSE BY PROBLEM   * Severe DKA (diabetic ketoacidosis) -- TREATED AND RESOLVED NOW - presented with pH 6.99, bicarb 9, glucose 453, gap 31 - DKA treatment protocol started per [facility] - Patient previously had taken 36 units of basal insulin at baseline, as well as glipizide, metformin, and Ozempic, but patient has not had medication in months due to losing job and insurance benefits - DKA physiology and metabolic acidosis resolved  - treated with levemir 25 units BID plus novolog 10 units TID with meals; added metformin ER 500 mg with supper   -- unfortunately due to having no insurance, we will discharge him on a plan that is less costly to patient out of pocket. Discussed with Education officer, museum.  DC on novolin (relion) 70/30 flexpen 15 units BID with meals, metformin ER 500 mg - titrate up to 1000 mg BID; titrate insulin outpatient for better glycemic control; close monitoring  of CBG strongly advised and RX for testing supplies provided;   Scrotal cellulitis and abscess - wound culture sent from drainage - awaiting culture results  - I have consulted Dr. Jeffie Pollock with [facility] and he arranged for I&D on 3/28 with Dr. Alyson Ingles - added Zosyn to vancomycin for anaerobic and pseudomonas coverage : prelim: G pos rods, G pos cocci in pairs - CT pelvis and scrotal US suggesting abscess  - working on glycemic control as noted -- Pt not willing to wait in hospital for culture results, insists on discharging home today due to Easter holiday;  We don't have final cultures back.  Pt agreeable to follow up with Dr. Alyson Ingles urologist -- discussed with pharm D, he was treated with 4 days of zosyn and vanc and now s/p I&D and wound draining well; prelim culture results suggesting staph species and will DC home on oral doxycycline 100 mg BID x 7 days.    Protein-calorie malnutrition, severe ([place]) -- appreciate dietitian consultation and recommendations  DM (diabetes mellitus) type 2, uncontrolled, with ketoacidosis ([place]) -- as evidenced by a previous A1c>14% -- current A1c 14.6% -- pt has not been able to afford his medication -- TOC consulted for assistance with medication and outpatient follow up -- added metformin ER 500 mg with titration up to 2 tabs po BID after a couple of weeks  -- will discharge home on novolin 70/30 kwikpen take 15 units BID with meals, can titrate doses outpatient as needed  -- Blood glucose meter kit with testing supplies prescribed -- hypoglycemia precautions and instructions to test BS at least 4-5 times per day  Sepsis due to cellulitis ([place]) - Admission HR 125, respiratory rate 22, leukocytosis 32.8 - AKI and lactic acidosis and leukocytosis resolved - Suspected source is infected scrotal wounds - vancomycin started, add Zosyn for anaerobic and pseudomonas coverage  - CT pelvis and scrotal US confirmed right scrotal abscess, treated with I&D 3/28  - Blood culture pending--NGTD  - Sepsis physiology RESOLVED   Dehydration - Secondary to DKA, severe  hypovolemia - With AKI and lactic acidosis - AKI resolved, RESOLVED   Hyperlipidemia - secondary to uncontrolled diabetes mellitus A1c>14%  -- working on glycemic control, check lipids outpatient and treat as needed; he has been on pravastatin in the past  AKI (acute kidney injury) ([place]) - Creatinine increased from 0.9>> 1.41>>0.43 - RESOLVED - Due to hypovolemia in the setting of DKA - DC IV fluids as he is eating and drinking well  Prolonged QT  interval - QTc 673 on admission  - Likely due to acidosis - Continue treatment of DKA and dehydration with IV fluids - Monitored on telemetry for 24 hours   Discharge Diagnoses:  Principal Problem:   Severe DKA (diabetic ketoacidosis) Active Problems:   Scrotal cellulitis and abscess   Prolonged QT interval   AKI (acute kidney injury) ([place])   Hyperlipidemia   Dehydration   Sepsis due to cellulitis ([place])   DM (diabetes mellitus) type 2, uncontrolled, with ketoacidosis ([place])   Protein-calorie malnutrition, severe ([place])   Discharge Instructions: Discharge Instructions     Ambulatory referral to Urology   Complete by: As directed       Allergies as of 02/23/2023       Reactions   Morphine Itching   Side effect Side effect        Medication List     STOP taking these medications    FreeStyle Libre 2 Sensor Misc  gabapentin 100 MG capsule Commonly known as: NEURONTIN   glipiZIDE 10 MG tablet Commonly known as: GLUCOTROL   Lantus SoloStar 100 UNIT/ML Solostar Pen Generic drug: insulin glargine   lisinopril 2.5 MG tablet Commonly known as: ZESTRIL   metFORMIN 1000 MG tablet Commonly known as: GLUCOPHAGE Replaced by: metFORMIN 500 MG 24 hr tablet   Ozempic (1 MG/DOSE) 4 MG/3ML Sopn Generic drug: Semaglutide (1 MG/DOSE)   pravastatin 40 MG tablet Commonly known as: PRAVACHOL       TAKE these medications    BD Pen Needle Nano U/F 32G X 4 MM Misc Generic drug: Insulin Pen Needle Use to inject insulin as needed What changed: See the new instructions.   Blood Glucose Monitoring Suppl Devi 1 each by Does not apply route in the morning, at noon, and at bedtime. May substitute to any manufacturer covered by patient's insurance.   BLOOD GLUCOSE TEST STRIPS Strp 1 each by In Vitro route in the morning, at noon, and at bedtime. May substitute to any manufacturer covered by patient's insurance.   doxycycline 100 MG capsule Commonly known as:  VIBRAMYCIN Take 1 capsule (100 mg total) by mouth 2 (two) times daily for 7 days.   Gerhardt's butt cream Crea Apply 1 Application topically 3 (three) times daily for 7 days.   Lancet Device Misc 1 each by Does not apply route in the morning, at noon, and at bedtime. May substitute to any manufacturer covered by patient's insurance.   Lancets Misc. Misc 1 each by Does not apply route in the morning, at noon, and at bedtime. May substitute to any manufacturer covered by patient's insurance.   metFORMIN 500 MG 24 hr tablet Commonly known as: GLUCOPHAGE-XR 1 po bid with meals x 1 week, then 2 po bid with meals Replaces: metFORMIN 1000 MG tablet   NovoLIN 70/30 Kwikpen (70-30) 100 UNIT/ML KwikPen Generic drug: insulin isophane & regular human KwikPen Inject 15 Units into the skin 2 (two) times daily with a meal.   oxycodone 5 MG capsule Commonly known as: OXY-IR Take 1 capsule (5 mg total) by mouth 3 (three) times daily as needed for up to 5 days for pain.        Follow-up Information     McKenzie, Candee Furbish, MD. Schedule an appointment as soon as possible for a visit in 1 week(s).   Specialty: Urology Why: Hospital Follow Up Contact information: [address]  [place] [address] [phone]         Wilburt Finlay, MD. Schedule an appointment as soon as possible for a visit in 1 week(s).   Specialty: Family Medicine Why: Hospital Follow Up Contact information: [address] [place]  [address] [phone]                Allergies  Allergen Reactions   Morphine Itching    Side effect Side effect    Allergies as of 02/23/2023       Reactions   Morphine Itching   Side effect Side effect        Medication List     STOP taking these medications    FreeStyle Libre 2 Sensor Misc   gabapentin 100 MG capsule Commonly known as: NEURONTIN   glipiZIDE 10 MG tablet Commonly known as: GLUCOTROL   Lantus SoloStar 100 UNIT/ML  Solostar Pen Generic drug: insulin glargine   lisinopril 2.5 MG tablet Commonly known as: ZESTRIL   metFORMIN 1000 MG tablet Commonly known as: GLUCOPHAGE Replaced  by: metFORMIN 500 MG 24 hr tablet   Ozempic (1 MG/DOSE) 4 MG/3ML Sopn Generic drug: Semaglutide (1 MG/DOSE)   pravastatin 40 MG tablet Commonly known as: PRAVACHOL       TAKE these medications    BD Pen Needle Nano U/F 32G X 4 MM Misc Generic drug: Insulin Pen Needle Use to inject insulin as needed What changed: See the new instructions.   Blood Glucose Monitoring Suppl Devi 1 each by Does not apply route in the morning, at noon, and at bedtime. May substitute to any manufacturer covered by patient's insurance.   BLOOD GLUCOSE TEST STRIPS Strp 1 each by In Vitro route in the morning, at noon, and at bedtime. May substitute to any manufacturer covered by patient's insurance.   doxycycline 100 MG capsule Commonly known as: VIBRAMYCIN Take 1 capsule (100 mg total) by mouth 2 (two) times daily for 7 days.   Gerhardt's butt cream Crea Apply 1 Application topically 3 (three) times daily for 7 days.   Lancet Device Misc 1 each by Does not apply route in the morning, at noon, and at bedtime. May substitute to any manufacturer covered by patient's insurance.   Lancets Misc. Misc 1 each by Does not apply route in the morning, at noon, and at bedtime. May substitute to any manufacturer covered by patient's insurance.   metFORMIN 500 MG 24 hr tablet Commonly known as: GLUCOPHAGE-XR 1 po bid with meals x 1 week, then 2 po bid with meals Replaces: metFORMIN 1000 MG tablet   NovoLIN 70/30 Kwikpen (70-30) 100 UNIT/ML KwikPen Generic drug: insulin isophane & regular human KwikPen Inject 15 Units into the skin 2 (two) times daily with a meal.   oxycodone 5 MG capsule Commonly known as: OXY-IR Take 1 capsule (5 mg total) by mouth 3 (three) times daily as needed for up to 5 days for pain.         Procedures/Studies: US SCROTUM  Result Date: 02/20/2023 CLINICAL DATA:  Two-week history of left scrotal abscess with 1 day history of drainage EXAM: ULTRASOUND OF SCROTUM TECHNIQUE: Complete ultrasound examination of the testicles, epididymis, and other scrotal structures was performed. COMPARISON:  CT pelvis dated 02/12/2023 FINDINGS: Right testicle Measurements: 4.0 x 2.3 x 2.3 cm, 10.9 mL. No mass or microlithiasis visualized. Left testicle Measurements: 4.5 x 2.4 x 2.1 cm, 11.6 mL. No mass or microlithiasis visualized. Asymmetric, heterogeneously hypoechoic soft tissue thickening overlying the left testicle associated with asymmetric hyperemia. Focal, heterogeneously hypoechoic area within the scrotal soft tissue overlying the epididymal head measures 2.5 x 2.1 cm. Right epididymis:  Normal in size and appearance. Left epididymis:  Normal in size and appearance. Hydrocele:  None visualized. Varicocele:  None visualized. IMPRESSION: 1. Asymmetric, heterogeneously hypoechoic soft tissue thickening overlying the left testicle associated with asymmetric hyperemia, likely cellulitis. 2. Focal, heterogeneously hypoechoic area within the scrotal soft tissue overlying the left epididymal head measures 2.5 x 2.1 cm, likely reflecting phlegmon/developing abscess. 3. Normal sonographic appearance of the testicles. Electronically Signed   By: Darrin Nipper M.D.   On: 02/20/2023 11:00   CT PELVIS WO CONTRAST  Result Date: 02/20/2023 CLINICAL DATA:  Soft tissue infection suspected, wounds on scrotum from ingrown hairs. EXAM: CT PELVIS WITHOUT CONTRAST TECHNIQUE: Multidetector CT imaging of the pelvis was performed following the standard protocol without intravenous contrast. RADIATION DOSE REDUCTION: This exam was performed according to the departmental dose-optimization program which includes automated exposure control, adjustment of the mA and/or kV according to patient size  and/or use of iterative reconstruction  technique. COMPARISON:  [id number]. FINDINGS: Urinary Tract:  No abnormality visualized. Bowel:  Unremarkable visualized pelvic bowel loops. Vascular/Lymphatic: Prominent lymph nodes are present in the inguinal region on the left, likely reactive. No significant vascular abnormality seen. Reproductive:  The prostate gland is within normal limits. Other: Increased density is noted in the scrotal wall. There are hypodense regions in the anterior aspect of the scrotum measuring 1.9 cm and 1.0 cm. There is skin thickening along the medial proximal left thigh with presumed packing material in this region, limiting evaluation. No subcutaneous emphysema is seen. Musculoskeletal: Degenerative changes are present in the lower lumbar spine. No acute osseous abnormality. IMPRESSION: Increased density in the scrotal wall with hypodense regions in the anterior scrotum measuring 1.9 and 1.0 cm, possible phlegmon or developing abscesses. No subcutaneous emphysema. Ultrasound is recommended for further evaluation. Electronically Signed   By: Brett Fairy M.D.   On: 02/20/2023 04:16   CT Angio Chest/Abd/Pel for Dissection W and/or Wo Contrast  Result Date: 02/20/2023 CLINICAL DATA:  Nausea and vomiting with chest pain for several hours, initial encounter EXAM: CT ANGIOGRAPHY CHEST, ABDOMEN AND PELVIS TECHNIQUE: Non-contrast CT of the chest was initially obtained. Multidetector CT imaging through the chest, abdomen and pelvis was performed using the standard protocol during bolus administration of intravenous contrast. Multiplanar reconstructed images and MIPs were obtained and reviewed to evaluate the vascular anatomy. RADIATION DOSE REDUCTION: This exam was performed according to the departmental dose-optimization program which includes automated exposure control, adjustment of the mA and/or kV according to patient size and/or use of iterative reconstruction technique. CONTRAST:  114mL OMNIPAQUE IOHEXOL 350 MG/ML SOLN  COMPARISON:  Chest x-ray from the previous day. FINDINGS: CTA CHEST FINDINGS Cardiovascular: Initial precontrast images show no hyperdense crescent to suggest acute aortic injury. Atherosclerotic calcifications are noted. Post-contrast images demonstrate no evidence of aneurysmal dilatation or dissection. Heart is not significantly enlarged. No large central pulmonary embolus is noted although not timed for embolus evaluation. Mediastinum/Nodes: Thoracic inlet is within normal limits. No hilar or mediastinal adenopathy is noted. The esophagus as visualized is within normal limits. Lungs/Pleura: Lungs are well aerated bilaterally. No focal infiltrate or sizable effusion is seen. No sizable parenchymal nodules are noted. Small calcified granuloma is noted in the left lower lobe. Musculoskeletal: No acute rib abnormality is noted. Mild degenerative changes of the thoracic spine are seen. Review of the MIP images confirms the above findings. CTA ABDOMEN AND PELVIS FINDINGS VASCULAR Aorta: Normal caliber aorta without aneurysm, dissection, vasculitis or significant stenosis. Celiac: Patent without evidence of aneurysm, dissection, vasculitis or significant stenosis. SMA: Patent without evidence of aneurysm, dissection, vasculitis or significant stenosis. Renals: Both renal arteries are patent without evidence of aneurysm, dissection, vasculitis, fibromuscular dysplasia or significant stenosis. IMA: Patent without evidence of aneurysm, dissection, vasculitis or significant stenosis. Inflow: Iliacs are within normal limits. Veins: No specific venous abnormality is noted. Review of the MIP images confirms the above findings. NON-VASCULAR Hepatobiliary: No focal liver abnormality is seen. No gallstones, gallbladder wall thickening, or biliary dilatation. Pancreas: Unremarkable. No pancreatic ductal dilatation or surrounding inflammatory changes. Spleen: Normal in size without focal abnormality. Adrenals/Urinary Tract:  Adrenal glands are within normal limits. Kidneys are well visualized bilaterally. No renal calculi or obstructive changes are seen. The bladder is well distended. Stomach/Bowel: No obstructive or inflammatory changes of the colon are seen. The appendix is well visualized and within normal limits. Small bowel and stomach are unremarkable. Lymphatic: No lymphadenopathy is  noted. Reproductive: Prostate is unremarkable. Other: No abdominal wall hernia or abnormality. No abdominopelvic ascites. Musculoskeletal: No acute or significant osseous findings. Review of the MIP images confirms the above findings. IMPRESSION: No evidence of acute aortic dissection or aneurysmal dilatation. No evidence of pulmonary emboli. Changes of prior granulomatous disease. No other focal abnormality is noted. Electronically Signed   By: Inez Catalina M.D.   On: 02/20/2023 00:40   DG Chest Port 1 View  Result Date: 02/19/2023 CLINICAL DATA:  Shortness of breath EXAM: PORTABLE CHEST 1 VIEW COMPARISON:  None Available. FINDINGS: No acute airspace disease. Normal cardiac size. No pneumothorax. Probable emphysema. IMPRESSION: No active disease. Electronically Signed   By: Donavan Foil M.D.   On: 02/19/2023 23:26     Subjective: Pt reports pain is nearly completely resolved; he reports that wound still draining well and he is doing wound care.  He says he is going home today to spend Easter with family not willing to stay any more days in hospital but says he will follow up with Dr. Alyson Ingles outpatient.   Discharge Exam: Vitals:   02/22/23 2100 02/23/23 0253  BP: 112/71 96/72  Pulse: 80 70  Resp: 18 16  Temp: 99.1 F (37.3 C) 98.3 F (36.8 C)  SpO2: 100% 100%   Vitals:   02/22/23 0404 02/22/23 1623 02/22/23 2100 02/23/23 0253  BP: 112/74 103/74 112/71 96/72  Pulse: 81 80 80 70  Resp: 18  18 16   Temp: 98.5 F (36.9 C) 98 F (36.7 C) 99.1 F (37.3 C) 98.3 F (36.8 C)  TempSrc: Oral Oral Oral Oral  SpO2: 100% 99% 100%  100%  Weight:      Height:       General exam: cachectic appearing, chronically ill appearing male, Appears calm and comfortable  Respiratory system: Clear to auscultation. Respiratory effort normal. Cardiovascular system: normal S1 & S2 heard. No JVD, murmurs, rubs, gallops or clicks. No pedal edema. Gastrointestinal system: Abdomen is nondistended, soft and nontender. No organomegaly or masses felt. Normal bowel sounds heard. 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  Central nervous system: Alert and oriented. No focal neurological deficits. Extremities: Symmetric 5 x 5 power. Skin: No rashes, lesions or ulcers. Psychiatry: Judgement and insight appear normal. Mood & affect appropriate.   The results of significant diagnostics from this hospitalization (including imaging, microbiology, ancillary and laboratory) are listed below for reference.     Microbiology: Recent Results (from the past 240 hour(s))  MRSA Next Gen by PCR, Nasal     Status: None   Collection Time: 02/20/23  2:28 AM   Specimen: Nasal Mucosa; Nasal Swab  Result Value Ref Range Status   MRSA by PCR Next Gen NOT DETECTED NOT DETECTED Final    Comment: (NOTE) The GeneXpert MRSA Assay (FDA approved for NASAL specimens only), is one component of a comprehensive MRSA colonization surveillance program. It is not intended to diagnose MRSA infection nor to guide or monitor treatment for MRSA infections. Test performance is not FDA approved in patients less than 98 years old. Performed at [facility], [address] [place], [place] [address]   Culture, blood (Routine X 2) w Reflex to ID Panel     Status: None (Preliminary result)   Collection Time: 02/20/23  4:35 AM   Specimen: BLOOD  Result Value Ref Range  Status   Specimen Description BLOOD BLOOD RIGHT ARM  Final   Special Requests   Final    BOTTLES DRAWN AEROBIC AND ANAEROBIC Blood Culture adequate volume   Culture   Final    NO GROWTH 3 DAYS Performed at [facility], [address] [place], [place] [address]    Report Status PENDING  Incomplete  Culture, blood (Routine X 2) w Reflex to ID Panel     Status: None (Preliminary result)   Collection Time: 02/20/23  4:36 AM   Specimen: BLOOD  Result Value Ref Range Status   Specimen Description BLOOD BLOOD RIGHT HAND  Final   Special Requests   Final    BOTTLES DRAWN AEROBIC AND ANAEROBIC Blood Culture adequate volume   Culture   Final    NO GROWTH 3 DAYS Performed at [facility], [address], [place], [place] [address]    Report Status PENDING  Incomplete  Aerobic Culture w Gram Stain (superficial specimen)     Status: None (Preliminary result)   Collection Time: 02/20/23  8:17 AM   Specimen: Scrotum  Result Value Ref Range Status   Specimen Description   Final    SCROTUM Performed at [facility], [address] [place], [place] [address]    Special Requests   Final    Immunocompromised Performed at [facility], [address] [place], [place] [address]    Gram Stain   Final    RARE WBC PRESENT,BOTH PMN AND MONONUCLEAR RARE GRAM POSITIVE RODS    Culture   Final    FEW STAPHYLOCOCCUS CAPITIS FEW STAPHYLOCOCCUS EPIDERMIDIS RARE STAPHYLOCOCCUS LUGDUNENSIS SUSCEPTIBILITIES TO FOLLOW Performed at [facility], [place] [address], [place], [place] [address]    Report Status PENDING  Incomplete  Anaerobic culture w Gram Stain     Status: None (Preliminary result)   Collection Time: 02/20/23  8:17 AM   Specimen: Scrotum  Result Value Ref Range Status   Specimen Description   Final    SCROTUM Performed at [facility], [address], [place], [place] [address]    Special Requests   Final    Immunocompromised Performed at [facility], [address],  [place], [place] [address]    Gram Stain PENDING  Incomplete   Culture   Final    NO ANAEROBES ISOLATED; CULTURE IN PROGRESS FOR 5 DAYS   Report Status PENDING  Incomplete  Aerobic Culture w Gram Stain (superficial specimen)     Status: None (Preliminary result)   Collection Time: 02/20/23  1:24 PM   Specimen: Scrotum  Result Value Ref Range Status   Specimen Description   Final    SCROTUM Performed at [facility], [address], [place], [place] [address]    Special Requests   Final    NONE Performed at [facility], [address] [place], [place] [address]    Gram Stain   Final    NO WBC SEEN MODERATE GRAM POSITIVE RODS RARE GRAM POSITIVE COCCI IN PAIRS    Culture   Final    CULTURE REINCUBATED FOR BETTER GROWTH MODERATE STAPHYLOCOCCUS LUGDUNENSIS SUBBING TO ISOLATE FOR WORKUP Performed at [facility], [place] [address] [address], [place], [place] [address]    Report Status PENDING  Incomplete     Labs: BNP (last 3 results) No results for input(s): "BNP" in the last 8760 hours. Basic Metabolic Panel: Recent Labs  Lab 02/20/23 0436 02/20/23 0850 02/20/23 1255 02/20/23 1625  02/21/23 0422 02/21/23 0552 02/22/23 0613  NA 133* 134* 129* 129*  --  130* 132*  K 2.6* 2.9* 3.0* 3.1* 3.0* 3.0* 3.9  CL 100 102 99 100  --  100 99  CO2 13* 18* 20* 21*  --  22 28  GLUCOSE 224* 218* 189* 161*  --  142* 123*  BUN 15 14 11 10   --  7 5*  CREATININE 0.92 0.78 0.58* 0.48* 0.43* 0.43* 0.51*  CALCIUM 8.8* 8.8* 8.2* 8.1*  --  8.0* 8.5*  MG 1.8  --   --   --  1.6*  --  1.9  PHOS 1.7*  --   --   --  1.1*  --  2.3*   Liver Function Tests: Recent Labs  Lab 02/19/23 2303  AST 14*  ALT 13  ALKPHOS 154*  BILITOT 2.6*  PROT 7.6  ALBUMIN 3.9   Recent Labs  Lab 02/19/23 2303  LIPASE 53*   No results for input(s): "AMMONIA" in the last 168 hours. CBC: Recent Labs  Lab 02/19/23 2303 02/20/23 0436 02/21/23 0422 02/22/23 0613  WBC 32.8* 27.3* 9.8 7.4  NEUTROABS  --  24.3* 7.6 4.9   HGB 19.3* 17.2* 13.6 13.4  HCT 56.6* 47.4 38.0* 38.0*  MCV 90.3 83.9 85.0 87.0  PLT 530* 356 206 175   Cardiac Enzymes: No results for input(s): "CKTOTAL", "CKMB", "CKMBINDEX", "TROPONINI" in the last 168 hours. BNP: Invalid input(s): "POCBNP" CBG: Recent Labs  Lab 02/22/23 1133 02/22/23 1624 02/22/23 2117 02/23/23 0256 02/23/23 0723  GLUCAP 111* 146* 157* 128* 180*   D-Dimer No results for input(s): "DDIMER" in the last 72 hours. Hgb A1c Recent Labs    02/20/23 1057  HGBA1C 14.6*   Lipid Profile No results for input(s): "CHOL", "HDL", "LDLCALC", "TRIG", "CHOLHDL", "LDLDIRECT" in the last 72 hours. Thyroid function studies No results for input(s): "TSH", "T4TOTAL", "T3FREE", "THYROIDAB" in the last 72 hours.  Invalid input(s): "FREET3" Anemia work up No results for input(s): "VITAMINB12", "FOLATE", "FERRITIN", "TIBC", "IRON", "RETICCTPCT" in the last 72 hours. Urinalysis    Component Value Date/Time   COLORURINE YELLOW 02/20/2023 0210   APPEARANCEUR CLEAR 02/20/2023 0210   LABSPEC 1.027 02/20/2023 0210   PHURINE 5.0 02/20/2023 0210   GLUCOSEU >=500 (A) 02/20/2023 0210   HGBUR MODERATE (A) 02/20/2023 0210   BILIRUBINUR NEGATIVE 02/20/2023 0210   KETONESUR 80 (A) 02/20/2023 0210   PROTEINUR 100 (A) 02/20/2023 0210   UROBILINOGEN 0.2 10/04/2014 1353   NITRITE NEGATIVE 02/20/2023 0210   LEUKOCYTESUR NEGATIVE 02/20/2023 0210   Sepsis Labs Recent Labs  Lab 02/19/23 2303 02/20/23 0436 02/21/23 0422 02/22/23 0613  WBC 32.8* 27.3* 9.8 7.4   Microbiology Recent Results (from the past 240 hour(s))  MRSA Next Gen by PCR, Nasal     Status: None   Collection Time: 02/20/23  2:28 AM   Specimen: Nasal Mucosa; Nasal Swab  Result Value Ref Range Status   MRSA by PCR Next Gen NOT DETECTED NOT DETECTED Final    Comment: (NOTE) The GeneXpert MRSA Assay (FDA approved for NASAL specimens only), is one component of a comprehensive MRSA colonization  surveillance program. It is not intended to diagnose MRSA infection nor to guide or monitor treatment for MRSA infections. Test performance is not FDA approved in patients less than 98 years old. Performed at [facility], [address] [place], [place] [address]   Culture, blood (Routine X 2) w Reflex to ID Panel     Status: None (Preliminary  result)   Collection Time: 02/20/23  4:35 AM   Specimen: BLOOD  Result Value Ref Range Status   Specimen Description BLOOD BLOOD RIGHT ARM  Final   Special Requests   Final    BOTTLES DRAWN AEROBIC AND ANAEROBIC Blood Culture adequate volume   Culture   Final    NO GROWTH 3 DAYS Performed at [facility], [address] [place], [place] [address]    Report Status PENDING  Incomplete  Culture, blood (Routine X 2) w Reflex to ID Panel     Status: None (Preliminary result)   Collection Time: 02/20/23  4:36 AM   Specimen: BLOOD  Result Value Ref Range Status   Specimen Description BLOOD BLOOD RIGHT HAND  Final   Special Requests   Final    BOTTLES DRAWN AEROBIC AND ANAEROBIC Blood Culture adequate volume   Culture   Final    NO GROWTH 3 DAYS Performed at [facility], [address] [place], [place] [address]    Report Status PENDING  Incomplete  Aerobic Culture w Gram Stain (superficial specimen)     Status: None (Preliminary result)   Collection Time: 02/20/23  8:17 AM   Specimen: Scrotum  Result Value Ref Range Status   Specimen Description   Final    SCROTUM Performed at [facility], [address], [place], [place] [address]    Special Requests   Final    Immunocompromised Performed at [facility], [address], [place], [place] [address]    Gram Stain   Final    RARE WBC PRESENT,BOTH PMN AND MONONUCLEAR RARE GRAM POSITIVE RODS    Culture   Final    FEW STAPHYLOCOCCUS CAPITIS FEW STAPHYLOCOCCUS EPIDERMIDIS RARE STAPHYLOCOCCUS LUGDUNENSIS SUSCEPTIBILITIES TO FOLLOW Performed at [facility], [place] [address]  [place], [place] [address]    Report Status PENDING  Incomplete  Anaerobic culture w Gram Stain     Status: None (Preliminary result)   Collection Time: 02/20/23  8:17 AM   Specimen: Scrotum  Result Value Ref Range Status   Specimen Description   Final    SCROTUM Performed at [facility], [address], [place], [place] [address]    Special Requests   Final    Immunocompromised Performed at [facility], [address] [place], [place] [address]    Gram Stain PENDING  Incomplete   Culture   Final    NO ANAEROBES ISOLATED; CULTURE IN PROGRESS FOR 5 DAYS   Report Status PENDING  Incomplete  Aerobic Culture w Gram Stain (superficial specimen)     Status: None (Preliminary result)   Collection Time: 02/20/23  1:24 PM   Specimen: Scrotum  Result Value Ref Range Status   Specimen Description   Final    SCROTUM Performed at [facility], [address], [place], [place] [address]    Special Requests   Final    NONE Performed at [facility], [address], [place], [place] [address]    Gram Stain   Final    NO WBC SEEN MODERATE GRAM POSITIVE RODS RARE GRAM POSITIVE COCCI IN PAIRS    Culture   Final    CULTURE REINCUBATED FOR BETTER GROWTH MODERATE STAPHYLOCOCCUS LUGDUNENSIS SUBBING TO ISOLATE FOR WORKUP Performed at [facility], [place] [address] [place], [place] [address]    Report Status PENDING  Incomplete    Time coordinating discharge: 41 mins   SIGNED:  Irwin Brakeman, MD  [facility] 02/23/2023, 10:55 AM  How to contact the [facility] Attending or Consulting provider [place] or covering provider during after hours [place], for this patient?  Check the care team in [facility] and look for a) attending/consulting [facility] provider listed and b) the [facility] team listed Log into [email or web address] and use [place]'s universal password to access. If you do not have the password, please contact the hospital operator. Locate the [facility] provider you are looking for under [facility]  and page to a number that you can be directly reached. If you still have difficulty reaching the provider, please page the [facility] (Director on Call) for the Hospitalists listed on amion for assistance.

## 2023-02-23 NOTE — TOC Transition Note (Signed)
Transition of Care [facility]) - CM/SW Discharge Note   Patient Details  Name: Bradley Miller MRN: [id number] Date of Birth: Feb 03, 1971  Transition of Care (TOC) CM/SW Contact:  Leo Rod, LCSW Phone Number: 02/23/2023, 10:44 AM   Clinical Narrative:    Pt reports that he will get insurance soon. Currently uninsured, CSW provided Match Voucher and education to Patient to get  medication.     Barriers to Discharge: Continued Medical Work up   Patient Goals and CMS Choice      Discharge Placement                         Discharge Plan and Services Additional resources added to the After Visit Summary for                                       Social Determinants of Health (SDOH) Interventions SDOH Screenings   Food Insecurity: No Food Insecurity (02/20/2023)  Housing: Low Risk  (02/20/2023)  Transportation Needs: No Transportation Needs (02/20/2023)  Utilities: At Risk (02/20/2023)  Tobacco Use: Medium Risk (02/19/2023)     Readmission Risk Interventions     No data to display

## 2023-02-24 ENCOUNTER — Telehealth: Payer: Self-pay

## 2023-02-24 LAB — AEROBIC CULTURE W GRAM STAIN (SUPERFICIAL SPECIMEN): Gram Stain: NONE SEEN

## 2023-02-24 NOTE — Telephone Encounter (Signed)
Attempted to reach patient today in regards to referral received from [facility] at [facility] in regards to patient being uninsured. I planned on educating the patient on the Care Connect program. The number provided on EPIC does not call out to reach out to patient

## 2023-02-26 LAB — ANAEROBIC CULTURE W GRAM STAIN

## 2023-02-26 LAB — CULTURE, BLOOD (ROUTINE X 2)
Culture: NO GROWTH
Culture: NO GROWTH
Special Requests: ADEQUATE
Special Requests: ADEQUATE

## 2023-06-05 ENCOUNTER — Other Ambulatory Visit: Payer: Self-pay | Admitting: Nurse Practitioner

## 2023-06-05 DIAGNOSIS — Z794 Long term (current) use of insulin: Secondary | ICD-10-CM

## 2023-06-06 ENCOUNTER — Telehealth: Payer: Self-pay | Admitting: Nurse Practitioner

## 2023-06-06 ENCOUNTER — Other Ambulatory Visit: Payer: Self-pay | Admitting: Nurse Practitioner

## 2023-06-06 DIAGNOSIS — E1165 Type 2 diabetes mellitus with hyperglycemia: Secondary | ICD-10-CM

## 2023-06-06 NOTE — Telephone Encounter (Signed)
Called pt in regards to after hours line per [place]. Patient was advised to go to ED for BS over 500. Pt Refused per after hours line and asking for a refill. Bradley Miller states he has not been seen since 2023 and he will have to go to urgent care to get his refill until he can be seen in office which I made for next appt in Aug. Mailed lab order

## 2023-06-13 ENCOUNTER — Ambulatory Visit: Payer: Self-pay | Admitting: Urology

## 2023-07-24 ENCOUNTER — Ambulatory Visit: Payer: Self-pay | Admitting: Nurse Practitioner

## 2023-07-24 DIAGNOSIS — Z794 Long term (current) use of insulin: Secondary | ICD-10-CM

## 2023-07-24 DIAGNOSIS — I10 Essential (primary) hypertension: Secondary | ICD-10-CM

## 2023-07-24 DIAGNOSIS — E782 Mixed hyperlipidemia: Secondary | ICD-10-CM

## 2023-07-24 DIAGNOSIS — E1165 Type 2 diabetes mellitus with hyperglycemia: Secondary | ICD-10-CM

## 2023-08-06 ENCOUNTER — Ambulatory Visit: Payer: Self-pay | Admitting: Nurse Practitioner

## 2023-09-11 LAB — COMPREHENSIVE METABOLIC PANEL
ALT: 9 [IU]/L (ref 0–44)
AST: 12 [IU]/L (ref 0–40)
Albumin: 3.7 g/dL — ABNORMAL LOW (ref 3.8–4.9)
Alkaline Phosphatase: 82 [IU]/L (ref 44–121)
BUN/Creatinine Ratio: 16 (ref 9–20)
BUN: 13 mg/dL (ref 6–24)
Bilirubin Total: 0.3 mg/dL (ref 0.0–1.2)
CO2: 25 mmol/L (ref 20–29)
Calcium: 9 mg/dL (ref 8.7–10.2)
Chloride: 101 mmol/L (ref 96–106)
Creatinine, Ser: 0.79 mg/dL (ref 0.76–1.27)
Globulin, Total: 1.9 g/dL (ref 1.5–4.5)
Glucose: 285 mg/dL — ABNORMAL HIGH (ref 70–99)
Potassium: 4.2 mmol/L (ref 3.5–5.2)
Sodium: 140 mmol/L (ref 134–144)
Total Protein: 5.6 g/dL — ABNORMAL LOW (ref 6.0–8.5)
eGFR: 107 mL/min/{1.73_m2} (ref >59)

## 2023-09-11 LAB — T4, FREE: Free T4: 1.29 ng/dL (ref 0.82–1.77)

## 2023-09-11 LAB — LIPID PANEL
Chol/HDL Ratio: 5.1 {ratio} — ABNORMAL HIGH (ref 0.0–5.0)
Cholesterol, Total: 177 mg/dL (ref 100–199)
HDL: 35 mg/dL — ABNORMAL LOW (ref >39)
LDL Chol Calc (NIH): 111 mg/dL — ABNORMAL HIGH (ref 0–99)
Triglycerides: 174 mg/dL — ABNORMAL HIGH (ref 0–149)
VLDL Cholesterol Cal: 31 mg/dL (ref 5–40)

## 2023-09-11 LAB — VITAMIN D 25 HYDROXY (VIT D DEFICIENCY, FRACTURES): Vit D, 25-Hydroxy: 15.9 ng/mL — ABNORMAL LOW (ref 30.0–100.0)

## 2023-09-11 LAB — TSH: TSH: 1.39 u[IU]/mL (ref 0.450–4.500)

## 2023-09-25 ENCOUNTER — Ambulatory Visit: Payer: BC Managed Care – PPO | Admitting: Nurse Practitioner

## 2023-09-25 ENCOUNTER — Telehealth: Payer: Self-pay | Admitting: Nurse Practitioner

## 2023-09-25 DIAGNOSIS — Z794 Long term (current) use of insulin: Secondary | ICD-10-CM

## 2023-09-25 DIAGNOSIS — I10 Essential (primary) hypertension: Secondary | ICD-10-CM

## 2023-09-25 DIAGNOSIS — E782 Mixed hyperlipidemia: Secondary | ICD-10-CM

## 2023-09-25 DIAGNOSIS — Z7984 Long term (current) use of oral hypoglycemic drugs: Secondary | ICD-10-CM

## 2023-09-25 NOTE — Telephone Encounter (Signed)
Yes, we can certainly discuss at his office visit.  It is imperative that he be testing glucose at home so I can best advise him at the appointment.

## 2023-09-25 NOTE — Telephone Encounter (Signed)
Pt notified will discuss at visit.

## 2023-09-25 NOTE — Telephone Encounter (Signed)
Pt rescheduled to Dec due to not having BG readings today. He did do his labs and wants to know if there is anything he needs to know from the results? I did advise him that the medication changes he was asking about (requesting ozempic/CGM) could be discussed at his follow up since his last visit was 01/2022.

## 2023-11-04 ENCOUNTER — Ambulatory Visit: Payer: BC Managed Care – PPO | Admitting: Nurse Practitioner

## 2023-11-04 DIAGNOSIS — Z7984 Long term (current) use of oral hypoglycemic drugs: Secondary | ICD-10-CM

## 2023-11-04 DIAGNOSIS — I10 Essential (primary) hypertension: Secondary | ICD-10-CM

## 2023-11-04 DIAGNOSIS — Z794 Long term (current) use of insulin: Secondary | ICD-10-CM

## 2023-11-04 DIAGNOSIS — E1165 Type 2 diabetes mellitus with hyperglycemia: Secondary | ICD-10-CM

## 2023-11-04 DIAGNOSIS — E782 Mixed hyperlipidemia: Secondary | ICD-10-CM

## 2024-04-12 ENCOUNTER — Other Ambulatory Visit: Payer: Self-pay | Admitting: Nurse Practitioner

## 2024-04-12 ENCOUNTER — Ambulatory Visit
Admission: RE | Admit: 2024-04-12 | Discharge: 2024-04-12 | Disposition: A | Discharge status: Home / Self Care | Source: Ambulatory Visit | Attending: Nurse Practitioner | Admitting: Nurse Practitioner

## 2024-04-12 DIAGNOSIS — Z021 Encounter for pre-employment examination: Secondary | ICD-10-CM

## 2024-04-22 ENCOUNTER — Ambulatory Visit (INDEPENDENT_AMBULATORY_CARE_PROVIDER_SITE_OTHER): Payer: Self-pay | Admitting: Nurse Practitioner

## 2024-04-22 ENCOUNTER — Encounter: Payer: Self-pay | Admitting: Nurse Practitioner

## 2024-04-22 VITALS — BP 110/78 | HR 89 | Ht 73.0 in | Wt 225.2 lb

## 2024-04-22 DIAGNOSIS — Z794 Long term (current) use of insulin: Secondary | ICD-10-CM

## 2024-04-22 DIAGNOSIS — E1165 Type 2 diabetes mellitus with hyperglycemia: Secondary | ICD-10-CM

## 2024-04-22 DIAGNOSIS — E559 Vitamin D deficiency, unspecified: Secondary | ICD-10-CM

## 2024-04-22 DIAGNOSIS — E782 Mixed hyperlipidemia: Secondary | ICD-10-CM

## 2024-04-22 DIAGNOSIS — I10 Essential (primary) hypertension: Secondary | ICD-10-CM

## 2024-04-22 LAB — POCT GLYCOSYLATED HEMOGLOBIN (HGB A1C): Hemoglobin A1C: 9 % — AB (ref 4.0–5.6)

## 2024-04-22 LAB — POCT GLUCOSE FINGERSTICK: Glucose: 246 — AB (ref 70–99)

## 2024-04-22 MED ORDER — NOVOLIN 70/30 FLEXPEN (70-30) 100 UNIT/ML ~~LOC~~ SUPN: 20.0000 [IU] | PEN_INJECTOR | Freq: Two times a day (BID) | SUBCUTANEOUS | Status: AC

## 2024-04-22 NOTE — Progress Notes (Signed)
 Endocrinology Follow Up Note       04/22/2024, 11:28 AM   Subjective:    Patient ID: Bradley Miller, male    DOB: June 08, 1971.  Bradley Miller is being seen in follow up after being seen in consultation for management of currently uncontrolled symptomatic diabetes requested by  Kotturi, Vinay K, MD.   Past Medical History:  Diagnosis Date   Diabetes mellitus without complication [facility])     Past Surgical History:  Procedure Laterality Date   ANKLE FRACTURE SURGERY      Social History   Socioeconomic History   Marital status: Divorced    Spouse name: Not on file   Number of children: Not on file   Years of education: Not on file   Highest education level: Not on file  Occupational History   Not on file  Tobacco Use   Smoking status: Some Days    Types: Cigarettes    Passive exposure: Past   Smokeless tobacco: Former  Advertising account planner   Vaping status: Former  Substance and Sexual Activity   Alcohol use: Yes    Comment: occ   Drug use: No   Sexual activity: Not on file  Other Topics Concern   Not on file  Social History Narrative   Not on file   Social Drivers of Health   Financial Resource Strain: Low Risk  (01/03/2022)   Received from [facility], [facility]   Overall Financial Resource Strain (CARDIA)    Difficulty of Paying Living Expenses: Not hard at all  Food Insecurity: No Food Insecurity (02/20/2023)   Hunger Vital Sign    Worried About Running Out of Food in the Last Year: Never true    Ran Out of Food in the Last Year: Never true  Transportation Needs: No Transportation Needs (02/20/2023)   PRAPARE - Administrator, Civil Service (Medical): No    Lack of Transportation (Non-Medical): No  Physical Activity: Inactive (01/03/2022)   Received from [facility], [facility]   Exercise Vital Sign    Days of Exercise per Week: 0 days    Minutes of Exercise per Session:  0 min  Stress: No Stress Concern Present (01/03/2022)   Received from [facility], [facility] of Occupational Health - Occupational Stress Questionnaire    Feeling of Stress : Only a little  Social Connections: Unknown (01/03/2022)   Received from [facility], [facility]   Social Connection and Isolation Panel [NHANES]    Frequency of Communication with Friends and Family: Twice a week    Frequency of Social Gatherings with Friends and Family: Twice a week    Attends Religious Services: 1 to 4 times per year    Active Member of [employer] or Organizations: No    Attends Engineer, structural: 1 to 4 times per year    Marital Status: Not on file    History reviewed. No pertinent family history.  Outpatient Encounter Medications as of 04/22/2024  Medication Sig   Blood Glucose Monitoring Suppl DEVI 1 each by Does not apply route in the morning, at noon,  and at bedtime. May substitute to any manufacturer covered by patient's insurance.   Insulin  Pen Needle (BD PEN NEEDLE NANO U/F) 32G X 4 MM MISC Use to inject insulin  as needed   [DISCONTINUED] insulin  isophane & regular human KwikPen (NOVOLIN  70/30 KWIKPEN) (70-30) 100 UNIT/ML KwikPen Inject 15 Units into the skin 2 (two) times daily with a meal.   insulin  isophane & regular human KwikPen (NOVOLIN  70/30 KWIKPEN) (70-30) 100 UNIT/ML KwikPen Inject 20 Units into the skin 2 (two) times daily with a meal.   [DISCONTINUED] metFORMIN  (GLUCOPHAGE -XR) 500 MG 24 hr tablet 1 po bid with meals x 1 week, then 2 po bid with meals (Patient not taking: Reported on 04/22/2024)   No facility-administered encounter medications on file as of 04/22/2024.    ALLERGIES: Allergies  Allergen Reactions   Morphine  Itching    Side effect Side effect     VACCINATION STATUS:  There is no immunization history on file for this patient.  Diabetes He presents for his follow-up diabetic visit. He has type 2 diabetes mellitus.  Onset time: diagnosed at approx age of 27. His disease course has been fluctuating (had been off all meds for some time). There are no hypoglycemic associated symptoms. Associated symptoms include blurred vision, fatigue, polydipsia and polyuria. There are no hypoglycemic complications. Symptoms are stable. Diabetic complications include peripheral neuropathy. Risk factors for coronary artery disease include diabetes mellitus, dyslipidemia, family history, male sex, hypertension and sedentary lifestyle. Current diabetic treatment includes intensive insulin  program (70/30 OTC). He is compliant with treatment some of the time. His weight is fluctuating dramatically. He is following a generally unhealthy diet. When asked about meal planning, he reported none. He has not had a previous visit with a dietitian. He participates in exercise intermittently. His overall blood glucose range is >200 mg/dl. (He presents today after long absence (2+ years) with his meter showing inconsistent glucose monitoring and gross hyperglycemia overall.  His POCT A1c tdoay is 9%, improving from last A1c of 14.6%.  Since last visit, he lost his insurance and thus coverage for his medications.  He has been taking OTC Novolin  70/30 15-20 units depending on his glucose spikes.  He has not been taking it prior to his meal.  I did do a fasting glucose today via fingerstick (has not yet eaten or drank anything) and it was 246 (this was needed for pre-employment clearance).  Analysis of his meter shows 7-day average of 312 (3 readings); 14-day average of 333 (5 readings); 30-day average of 364 (16 readings); 90-day average of 309 (55 readings).) An ACE inhibitor/angiotensin II receptor blocker is being taken. He does not see a podiatrist.Eye exam is current.  Hypertension This is a chronic problem. The current episode started more than 1 year ago. The problem has been resolved since onset. The problem is controlled. Associated symptoms include  blurred vision. There are no associated agents to hypertension. Risk factors for coronary artery disease include diabetes mellitus, dyslipidemia, family history, male gender and sedentary lifestyle. Past treatments include ACE inhibitors. The current treatment provides moderate improvement. Compliance problems include diet and exercise.   Hyperlipidemia This is a chronic problem. The current episode started more than 1 year ago. The problem is uncontrolled. Recent lipid tests were reviewed and are high. Exacerbating diseases include diabetes. Factors aggravating his hyperlipidemia include fatty foods. Current antihyperlipidemic treatment includes statins. The current treatment provides moderate improvement of lipids. Compliance problems include adherence to diet and adherence to exercise.  Risk factors for  coronary artery disease include diabetes mellitus, dyslipidemia, family history, male sex, hypertension and a sedentary lifestyle.     Review of systems  Constitutional: + fluctuating body weight, current Body mass index is 29.71 kg/m., no fatigue, no subjective hyperthermia, no subjective hypothermia Eyes: + blurry vision (has cataracts), no xerophthalmia ENT: no sore throat, no nodules palpated in throat, no dysphagia/odynophagia, no hoarseness Cardiovascular: no chest pain, no shortness of breath, no palpitations Respiratory: no cough, no shortness of breath Gastrointestinal: no nausea/vomiting/diarrhea Musculoskeletal: no muscle/joint aches Skin: no rashes, no hyperemia Neurological: no tremors, no numbness, no tingling, no dizziness Psychiatric: no depression, no anxiety  Objective:     BP 110/78 (BP Location: Right Arm, Patient Position: Sitting, Cuff Size: Large)   Pulse 89   Ht 6\' 1"  (1.854 m)   Wt 225 lb 3.2 oz (102.2 kg)   BMI 29.71 kg/m   Wt Readings from Last 3 Encounters:  04/22/24 225 lb 3.2 oz (102.2 kg)  02/21/23 166 lb 10.7 oz (75.6 kg)  02/13/22 209 lb (94.8 kg)      BP Readings from Last 3 Encounters:  04/22/24 110/78  02/23/23 96/72  02/13/22 112/74      Physical Exam- Limited  Constitutional:  Body mass index is 29.71 kg/m. , not in acute distress, normal state of mind Eyes:  EOMI, no exophthalmos Musculoskeletal: no gross deformities, strength intact in all four extremities, no gross restriction of joint movements Skin:  no rashes, no hyperemia Neurological: no tremor with outstretched hands    CMP ( most recent) CMP     Component Value Date/Time   NA 140 09/10/2023 1005   K 4.2 09/10/2023 1005   CL 101 09/10/2023 1005   CO2 25 09/10/2023 1005   GLUCOSE 285 (H) 09/10/2023 1005   GLUCOSE 123 (H) 02/22/2023 0613   BUN 13 09/10/2023 1005   CREATININE 0.79 09/10/2023 1005   CALCIUM 9.0 09/10/2023 1005   PROT 5.6 (L) 09/10/2023 1005   ALBUMIN 3.7 (L) 09/10/2023 1005   AST 12 09/10/2023 1005   ALT 9 09/10/2023 1005   ALKPHOS 82 09/10/2023 1005   BILITOT 0.3 09/10/2023 1005   GFRNONAA >60 02/22/2023 0613   GFRAA >90 10/04/2014 1405     Diabetic Labs (most recent): Lab Results  Component Value Date   HGBA1C 9.0 (A) 04/22/2024   HGBA1C 14.6 (H) 02/20/2023   HGBA1C 14.8 01/05/2022     Lipid Panel ( most recent) Lipid Panel     Component Value Date/Time   CHOL 177 09/10/2023 1005   TRIG 174 (H) 09/10/2023 1005   HDL 35 (L) 09/10/2023 1005   CHOLHDL 5.1 (H) 09/10/2023 1005   LDLCALC 111 (H) 09/10/2023 1005   LABVLDL 31 09/10/2023 1005      Lab Results  Component Value Date   TSH 1.390 09/10/2023   FREET4 1.29 09/10/2023           Assessment & Plan:   1) Type 2 diabetes mellitus with hyperglycemia, with long-term current use of insulin  (HCC)  He presents today after long absence (2+ years) with his meter showing inconsistent glucose monitoring and gross hyperglycemia overall.  His POCT A1c tdoay is 9%, improving from last A1c of 14.6%.  Since last visit, he lost his insurance and thus coverage for his  medications.  He has been taking OTC Novolin  70/30 15-20 units depending on his glucose spikes.  He has not been taking it prior to his meal.  I did do a fasting  glucose today via fingerstick (has not yet eaten or drank anything) and it was 246 (this was needed for pre-employment clearance).  Analysis of his meter shows 7-day average of 312 (3 readings); 14-day average of 333 (5 readings); 30-day average of 364 (16 readings); 90-day average of 309 (55 readings).  - Bradley Miller has currently uncontrolled symptomatic type 2 DM since 53 years of age.   -Recent labs reviewed.  - I had a long discussion with him about the progressive nature of diabetes and the pathology behind its complications. -his diabetes is not currently complicated but he remains at a high risk for more acute and chronic complications which include CAD, CVA, CKD, retinopathy, and neuropathy. These are all discussed in detail with him.  The following Lifestyle Medicine recommendations according to American College of Lifestyle Medicine [facility]) were discussed and offered to patient and he agrees to start the journey:  A. Whole Foods, Plant-based plate comprising of fruits and vegetables, plant-based proteins, whole-grain carbohydrates was discussed in detail with the patient.   A list for source of those nutrients were also provided to the patient.  Patient will use only water or unsweetened tea for hydration. B.  The need to stay away from risky substances including alcohol, smoking; obtaining 7 to 9 hours of restorative sleep, at least 150 minutes of moderate intensity exercise weekly, the importance of healthy social connections,  and stress reduction techniques were discussed. C.  A full color page of  Calorie density of various food groups per pound showing examples of each food groups was provided to the patient.  - Nutritional counseling repeated at each appointment due to patients tendency to fall back in to old habits.  -  The patient admits there is a room for improvement in their diet and drink choices. -  Suggestion is made for the patient to avoid simple carbohydrates from their diet including Cakes, Sweet Desserts / Pastries, Ice Cream, Soda (diet and regular), Sweet Tea, Candies, Chips, Cookies, Sweet Pastries, Store Bought Juices, Alcohol in Excess of 1-2 drinks a day, Artificial Sweeteners, Coffee Creamer, and "Sugar-free" Products. This will help patient to have stable blood glucose profile and potentially avoid unintended weight gain.   - I encouraged the patient to switch to unprocessed or minimally processed complex starch and increased protein intake (animal or plant source), fruits, and vegetables.   - Patient is advised to stick to a routine mealtimes to eat 3 meals a day and avoid unnecessary snacks (to snack only to correct hypoglycemia).  - he will be scheduled with Penny Crumpton, RDN, CDE for diabetes education.  - I have approached him with the following individualized plan to manage his diabetes and patient agrees:   -He is advised to take his Novolin  70/30 20 units twice daily BEFORE meals if glucose is above 90 and he is eating.  Will not start him on any other meds at this time due to affordability given he has no insurance yet.  -he is encouraged to start consistently monitoring glucose at least 3 times daily, before breakfast, before supper and before bed, and to call the clinic if he has readings less than 70 or above 300 for 3 tests in a row.    - he is warned not to take insulin  without proper monitoring per orders. - Adjustment parameters are given to him for hypo and hyperglycemia in writing.  - Specific targets for  A1c; LDL, HDL, and Triglycerides were discussed with the patient.  2) Blood Pressure /Hypertension:  his blood pressure is controlled to target without the use of antihypertensive medications.  He was on ACE in the past but disappeared from care and is no longer on it  due to cost.  3) Lipids/Hyperlipidemia:    Review of his recent lipid panel from 09/10/23 showed uncontrolled LDL at 111 and elevated triglycerides of 174 (improved) .  he is not on any statin medications at this time.  He was previously prescribed one but disappeared from care and is no longer on it due to cost.  Will defer this to PCP.  4)  Weight/Diet:  his Body mass index is 29.71 kg/m.  -    he is NOT a candidate for major weight loss. I discussed with him the fact that loss of 5 - 10% of his  current body weight will have the most impact on his diabetes management.  Exercise, and detailed carbohydrates information provided  -  detailed on discharge instructions.  5) Chronic Care/Health Maintenance: -he is on ACEI/ARB and Statin medications and is encouraged to initiate and continue to follow up with Ophthalmology, Dentist, Podiatrist at least yearly or according to recommendations, and advised to stay away from smoking. I have recommended yearly flu vaccine and pneumonia vaccine at least every 5 years; moderate intensity exercise for up to 150 minutes weekly; and sleep for at least 7 hours a day.  6) Vitamin D  deficiency His recent vitamin d  level from 09/10/23 was low at 15.9.  He does not take any supplement for this.   - he is advised to maintain close follow up with Kotturi, Vinay K, MD for primary care needs, as well as his other providers for optimal and coordinated care.   - I did sign off on his pre-employment form but did note that he needs better compliance with medications for optimal diabetes management and health overall.   I spent  39  minutes in the care of the patient today including review of labs from CMP, Lipids, Thyroid  Function, Hematology (current and previous including abstractions from other facilities); face-to-face time discussing  his blood glucose readings/logs, discussing hypoglycemia and hyperglycemia episodes and symptoms, medications doses, his options of  short and long term treatment based on the latest standards of care / guidelines;  discussion about incorporating lifestyle medicine;  and documenting the encounter. Risk reduction counseling performed per USPSTF guidelines to reduce obesity and cardiovascular risk factors.     Please refer to Patient Instructions for Blood Glucose Monitoring and Insulin /Medications Dosing Guide"  in media tab for additional information. Please  also refer to " Patient Self Inventory" in the Media  tab for reviewed elements of pertinent patient history.  Beckey Bourgeois participated in the discussions, expressed understanding, and voiced agreement with the above plans.  All questions were answered to his satisfaction. he is encouraged to contact clinic should he have any questions or concerns prior to his return visit.     Follow up plan: - Return in about 4 months (around 08/23/2024) for Diabetes F/U with A1c in office, No previsit labs, Bring meter and logs.   Hulon Magic, [facility] [address] [place], [place] [address] Phone: [phone] Fax: [phone]  04/22/2024, 11:28 AM

## 2024-05-17 ENCOUNTER — Emergency Department (HOSPITAL_COMMUNITY)
Admission: EM | Admit: 2024-05-17 | Discharge: 2024-05-17 | Disposition: A | Discharge status: Home / Self Care | Payer: Self-pay

## 2024-05-17 ENCOUNTER — Encounter (HOSPITAL_COMMUNITY): Payer: Self-pay | Admitting: Emergency Medicine

## 2024-05-17 ENCOUNTER — Other Ambulatory Visit: Payer: Self-pay

## 2024-05-17 ENCOUNTER — Emergency Department (HOSPITAL_COMMUNITY): Payer: Self-pay

## 2024-05-17 DIAGNOSIS — Z794 Long term (current) use of insulin: Secondary | ICD-10-CM | POA: Diagnosis not present

## 2024-05-17 DIAGNOSIS — X58XXXA Exposure to other specified factors, initial encounter: Secondary | ICD-10-CM | POA: Insufficient documentation

## 2024-05-17 DIAGNOSIS — M79675 Pain in left toe(s): Secondary | ICD-10-CM | POA: Diagnosis present

## 2024-05-17 DIAGNOSIS — S92535A Nondisplaced fracture of distal phalanx of left lesser toe(s), initial encounter for closed fracture: Secondary | ICD-10-CM | POA: Diagnosis not present

## 2024-05-17 DIAGNOSIS — E119 Type 2 diabetes mellitus without complications: Secondary | ICD-10-CM | POA: Insufficient documentation

## 2024-05-17 NOTE — ED Triage Notes (Signed)
 Pt reports left middle toe pain since this morning. Denies injury to area that he recalls. Bruising noted. Hx diabetes

## 2024-05-17 NOTE — ED Provider Notes (Signed)
 [place] EMERGENCY DEPARTMENT AT [facility] Provider Note   CSN: [id number] Arrival date & time: 05/17/24  2015     Patient presents with: Toe Pain   Bradley Miller is a 53 y.o. male here for evaluation of left third toe bruising.  He thinks he might of stubbed his toe in the middle of the night.  Has a history of diabetes and has decree sensation to his feet.  He follows with endocrinology however does not a PCP or podiatrist.  He noted when he woke up this morning he had some pain and swelling and bruising to the left third distal digit.  He denies any drainage, fevers.  Thinks he might of tripped on a toy bucket.  He was told his to his diabetes he is at high risk for infections and wanted to make sure nothing else was going on.   HPI     Prior to Admission medications   Medication Sig Start Date End Date Taking? Authorizing Provider  Blood Glucose Monitoring Suppl DEVI 1 each by Does not apply route in the morning, at noon, and at bedtime. May substitute to any manufacturer covered by patient's insurance. 02/23/23   Vicci Afton CROME, MD  insulin  isophane & regular human KwikPen (NOVOLIN  70/30 KWIKPEN) (70-30) 100 UNIT/ML KwikPen Inject 20 Units into the skin 2 (two) times daily with a meal. 04/22/24   Reardon, Benton PARAS, NP  Insulin  Pen Needle (BD PEN NEEDLE NANO U/F) 32G X 4 MM MISC Use to inject insulin  as needed 02/23/23   Vicci Afton CROME, MD    Allergies: Morphine     Review of Systems  Constitutional: Negative.   HENT: Negative.    Respiratory: Negative.    Cardiovascular: Negative.   Gastrointestinal: Negative.   Genitourinary: Negative.   Musculoskeletal:        Left third digit toe pain  Skin:  Positive for color change.  Neurological: Negative.   All other systems reviewed and are negative.   Updated Vital Signs BP 110/75   Pulse 67   Temp 98.3 F (36.8 C) (Oral)   Resp 16   SpO2 98%   Physical Exam Vitals and nursing note reviewed.   Constitutional:      General: He is not in acute distress.    Appearance: He is well-developed. He is not ill-appearing, toxic-appearing or diaphoretic.  HENT:     Head: Atraumatic.   Eyes:     Pupils: Pupils are equal, round, and reactive to light.    Cardiovascular:     Rate and Rhythm: Normal rate and regular rhythm.     Pulses:          Dorsalis pedis pulses are 2+ on the right side and 2+ on the left side.       Posterior tibial pulses are 2+ on the right side and 2+ on the left side.  Pulmonary:     Effort: Pulmonary effort is normal. No respiratory distress.  Abdominal:     General: There is no distension.     Palpations: Abdomen is soft.   Musculoskeletal:        General: Normal range of motion.     Cervical back: Normal range of motion and neck supple.     Comments: Tenderness distal third metatarsal.  Able to wiggle toes without difficulty.  No tenderness midfoot, proximal foot.  He has 2+ DP, PT pulses.  No cellulitic changes.  No fluctuance induration.  No subungual hematoma to  nail.  No ulcerations.  He has good cap refill to the toe   Skin:    General: Skin is warm and dry.     Comments: Bruising to left third distal metatarsal   Neurological:     General: No focal deficit present.     Mental Status: He is alert and oriented to person, place, and time.     Comments: intact sensation    (all labs ordered are listed, but only abnormal results are displayed) Labs Reviewed - No data to display  EKG: None  Radiology: DG Toe 3rd Left Result Date: 05/17/2024 CLINICAL DATA:  Pain.  Injury. EXAM: LEFT THIRD TOE COMPARISON:  None Available. FINDINGS: Left third toe pain there is oblique lucency through the distal tuft of the third toe worrisome for nondisplaced fracture. Joint spaces are maintained. Soft tissues are within normal limits. IMPRESSION: Nondisplaced fracture distal tuft of the third toe. Electronically Signed   By: Greig Pique M.D.   On: 05/17/2024  20:58     .Ortho Injury Treatment  Date/Time: 05/17/2024 10:56 PM  Performed by: Edie Rosebud LABOR, PA-C Authorized by: Edie Rosebud LABOR, PA-C   Consent:    Consent obtained:  Verbal   Consent given by:  Patient   Risks discussed:  Fracture, nerve damage, restricted joint movement, vascular damage, stiffness, recurrent dislocation and irreducible dislocation   Alternatives discussed:  No treatment, alternative treatment, immobilization, referral and delayed treatmentInjury location: toe Location details: left third toe Injury type: fracture Fracture type: distal phalanx Pre-procedure neurovascular assessment: neurovascularly intact Pre-procedure distal perfusion: normal Pre-procedure neurological function: normal Pre-procedure range of motion: normal  Anesthesia: Local anesthesia used: no  Patient sedated: NoManipulation performed: no Immobilization: tape Splint Applied by: ED Provider Post-procedure neurovascular assessment: post-procedure neurovascularly intact Post-procedure distal perfusion: normal Post-procedure neurological function: normal Post-procedure range of motion: normal      Medications Ordered in the ED - No data to display  53 year old history of diabetes no neuropathy here for evaluation of possible injury to left third metatarsal.  States he thinks he might of hit his foot in the middle of the night.  Here he has some bruising surrounding the distal aspect of his toe.  He has no ulcerations or skin breakdown.  His foot is well-perfused with 2+ DP, PT pulses.  He has no evidence of ischemia.  He has good cap refill to the toe.  He has no evidence of infectious process.  Will plan on x-ray  Imaging personally viewed interpreted X-ray shows nondisplaced fracture distal tuft on the third toe.  Think this is likely the cause of patient's pain and bruising.  His toes were buddy taped.  I encouraged her to follow-up with podiatry shoe to ensure the area  improves.  He will return for any worsening symptoms  At this time low suspicion for ischemia, infectious process such as osteomyelitis, gangrene, dislocation.  The patient has been appropriately medically screened and/or stabilized in the ED. I have low suspicion for any other emergent medical condition which would require further screening, evaluation or treatment in the ED or require inpatient management.  Patient is hemodynamically stable and in no acute distress.  Patient able to ambulate in department prior to ED.  Evaluation does not show acute pathology that would require ongoing or additional emergent interventions while in the emergency department or further inpatient treatment.  I have discussed the diagnosis with the patient and answered all questions.  Pain is been managed while in the  emergency department and patient has no further complaints prior to discharge.  Patient is comfortable with plan discussed in room and is stable for discharge at this time.  I have discussed strict return precautions for returning to the emergency department.  Patient was encouraged to follow-up with PCP/specialist refer to at discharge.                                    Medical Decision Making Amount and/or Complexity of Data Reviewed Radiology: ordered.        Final diagnoses:  Closed nondisplaced fracture of distal phalanx of lesser toe of left foot, initial encounter    ED Discharge Orders     None          Milaya Hora A, PA-C 05/17/24 2257    Neysa Caron PARAS, DO 05/18/24 0021

## 2024-05-17 NOTE — Discharge Instructions (Addendum)
 It was a pleasure taking care of you here today.  Recommend buddy taping the toes over the next week.  Keep a close eye on this area.  X-ray today showed a broken bone to the toe follow-up with Orthopedics for reevaluation in 2 days. Call to schedule and appointment.  Return for any new or worsening symptoms

## 2024-06-27 ENCOUNTER — Encounter (HOSPITAL_COMMUNITY): Payer: Self-pay

## 2024-06-27 ENCOUNTER — Emergency Department (HOSPITAL_COMMUNITY)
Admission: EM | Admit: 2024-06-27 | Discharge: 2024-06-28 | Disposition: A | Discharge status: Home / Self Care | Attending: Emergency Medicine | Admitting: Emergency Medicine

## 2024-06-27 ENCOUNTER — Other Ambulatory Visit: Payer: Self-pay

## 2024-06-27 DIAGNOSIS — M79604 Pain in right leg: Secondary | ICD-10-CM | POA: Insufficient documentation

## 2024-06-27 DIAGNOSIS — K0889 Other specified disorders of teeth and supporting structures: Secondary | ICD-10-CM | POA: Diagnosis present

## 2024-06-27 DIAGNOSIS — Z794 Long term (current) use of insulin: Secondary | ICD-10-CM | POA: Insufficient documentation

## 2024-06-27 DIAGNOSIS — E119 Type 2 diabetes mellitus without complications: Secondary | ICD-10-CM | POA: Diagnosis not present
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
## 2024-06-28 ENCOUNTER — Emergency Department (HOSPITAL_COMMUNITY)

## 2024-06-28 MED ORDER — HYDROCODONE-ACETAMINOPHEN 5-325 MG PO TABS
1.0000 | ORAL_TABLET | Freq: Once | ORAL | Status: AC
  Administered 2024-06-28: 1 via ORAL
  Filled 2024-06-28: qty 1

## 2024-06-28 MED ORDER — OXYCODONE-ACETAMINOPHEN 5-325 MG PO TABS: 1.0000 | ORAL_TABLET | Freq: Four times a day (QID) | ORAL | 0 refills | Status: AC | PRN

## 2024-06-28 MED ORDER — KETOROLAC TROMETHAMINE 30 MG/ML IJ SOLN
30.0000 mg | Freq: Once | INTRAMUSCULAR | Status: AC
Start: 2024-06-28 — End: 2024-06-28
  Administered 2024-06-28: 30 mg via INTRAMUSCULAR
  Filled 2024-06-28: qty 1

## 2024-06-28 MED ORDER — AMOXICILLIN-POT CLAVULANATE 875-125 MG PO TABS
1.0000 | ORAL_TABLET | Freq: Once | ORAL | Status: AC
  Administered 2024-06-28: 1 via ORAL
  Filled 2024-06-28: qty 1

## 2024-06-28 MED ORDER — AMOXICILLIN-POT CLAVULANATE 875-125 MG PO TABS: 1.0000 | ORAL_TABLET | Freq: Two times a day (BID) | ORAL | 0 refills | Status: AC

## 2024-06-28 NOTE — ED Notes (Signed)
 Patient transported to CT
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
## 2024-06-28 NOTE — Discharge Instructions (Addendum)
 It was a pleasure taking care of you here today.  I prescribed you some antibiotics for your dental pain.  Make sure to follow-up outpatient, return for any worsening symptoms

## 2024-06-28 NOTE — ED Notes (Signed)
 AVS provided by edp was reviewed with pt. Pt verbalized understanding with no additional questions at this time. Pharmacy verified by pt.

## 2024-08-25 ENCOUNTER — Ambulatory Visit: Payer: Self-pay | Admitting: Nurse Practitioner

## 2024-08-25 DIAGNOSIS — E782 Mixed hyperlipidemia: Secondary | ICD-10-CM

## 2024-08-25 DIAGNOSIS — E559 Vitamin D deficiency, unspecified: Secondary | ICD-10-CM

## 2024-08-25 DIAGNOSIS — E1165 Type 2 diabetes mellitus with hyperglycemia: Secondary | ICD-10-CM

## 2024-08-25 DIAGNOSIS — I10 Essential (primary) hypertension: Secondary | ICD-10-CM
